# Patient Record
Sex: Female | Born: 1970 | Race: White | Hispanic: No | Marital: Married | Smoking: Never smoker
Health system: Southern US, Community
[De-identification: ages and names within clinical notes are randomized; demographics above are authoritative.]

## PROBLEM LIST (undated history)

## (undated) DIAGNOSIS — Z87442 Personal history of urinary calculi: Secondary | ICD-10-CM

## (undated) DIAGNOSIS — R112 Nausea with vomiting, unspecified: Secondary | ICD-10-CM

## (undated) DIAGNOSIS — Z973 Presence of spectacles and contact lenses: Secondary | ICD-10-CM

## (undated) DIAGNOSIS — Z8489 Family history of other specified conditions: Secondary | ICD-10-CM

## (undated) DIAGNOSIS — I341 Nonrheumatic mitral (valve) prolapse: Secondary | ICD-10-CM

## (undated) DIAGNOSIS — J45909 Unspecified asthma, uncomplicated: Secondary | ICD-10-CM

## (undated) DIAGNOSIS — I10 Essential (primary) hypertension: Secondary | ICD-10-CM

## (undated) DIAGNOSIS — Z9889 Other specified postprocedural states: Secondary | ICD-10-CM

## (undated) HISTORY — PX: ORIF WRIST FRACTURE: SHX2133

## (undated) HISTORY — PX: ABDOMINAL HYSTERECTOMY: SHX81

## (undated) HISTORY — PX: TONSILLECTOMY AND ADENOIDECTOMY: SUR1326

## (undated) HISTORY — PX: TUBAL LIGATION: SHX77

## (undated) HISTORY — PX: KNEE ARTHROSCOPY: SUR90

## (undated) HISTORY — PX: OOPHORECTOMY: SHX86

## (undated) HISTORY — PX: NASAL SINUS SURGERY: SHX719

---

## 1999-10-01 DIAGNOSIS — I639 Cerebral infarction, unspecified: Secondary | ICD-10-CM

## 1999-10-01 HISTORY — DX: Cerebral infarction, unspecified: I63.9

## 2004-04-10 ENCOUNTER — Other Ambulatory Visit: Payer: Self-pay

## 2005-01-21 ENCOUNTER — Ambulatory Visit: Payer: Self-pay | Admitting: Family Medicine

## 2007-03-11 ENCOUNTER — Ambulatory Visit: Payer: Self-pay | Admitting: Unknown Physician Specialty

## 2007-03-12 ENCOUNTER — Inpatient Hospital Stay: Payer: Self-pay | Admitting: Unknown Physician Specialty

## 2007-04-06 ENCOUNTER — Encounter: Payer: Self-pay | Admitting: Maternal & Fetal Medicine

## 2009-01-26 ENCOUNTER — Ambulatory Visit: Payer: Self-pay | Admitting: Family Medicine

## 2010-10-12 ENCOUNTER — Encounter: Payer: Self-pay | Admitting: Obstetrics and Gynecology

## 2010-10-31 ENCOUNTER — Encounter: Payer: Self-pay | Admitting: Obstetrics and Gynecology

## 2010-11-29 ENCOUNTER — Encounter: Payer: Self-pay | Admitting: Obstetrics and Gynecology

## 2011-01-29 ENCOUNTER — Ambulatory Visit: Payer: Self-pay | Admitting: Family Medicine

## 2011-05-12 ENCOUNTER — Emergency Department: Payer: Self-pay | Admitting: Internal Medicine

## 2012-06-05 LAB — CBC AND DIFFERENTIAL
HEMATOCRIT: 40 % (ref 36–46)
HEMOGLOBIN: 14.2 g/dL (ref 12.0–16.0)
Neutrophils Absolute: 3 /uL
Platelets: 240 10*3/uL (ref 150–399)
WBC: 5.5 10*3/mL

## 2012-06-05 LAB — BASIC METABOLIC PANEL
BUN: 18 mg/dL (ref 4–21)
CREATININE: 1 mg/dL (ref 0.5–1.1)
GLUCOSE: 78 mg/dL
POTASSIUM: 4.3 mmol/L (ref 3.4–5.3)
SODIUM: 139 mmol/L (ref 137–147)

## 2012-06-05 LAB — HEPATIC FUNCTION PANEL
ALK PHOS: 49 U/L (ref 25–125)
ALT: 11 U/L (ref 7–35)
AST: 15 U/L (ref 13–35)
BILIRUBIN, TOTAL: 0.6 mg/dL

## 2012-06-05 LAB — LIPID PANEL
Cholesterol: 157 mg/dL (ref 0–200)
HDL: 75 mg/dL — AB (ref 35–70)
LDL Cholesterol: 70 mg/dL
LDl/HDL Ratio: 0.9
Triglycerides: 61 mg/dL (ref 40–160)

## 2012-06-05 LAB — TSH: TSH: 2.16 u[IU]/mL (ref 0.41–5.90)

## 2015-04-14 LAB — VITAMIN D 25 HYDROXY (VIT D DEFICIENCY, FRACTURES): VIT D 25 HYDROXY: 36.6

## 2015-04-14 LAB — BASIC METABOLIC PANEL
BUN: 21 mg/dL (ref 4–21)
CREATININE: 1 mg/dL (ref 0.5–1.1)
Glucose: 76 mg/dL
Potassium: 4 mmol/L (ref 3.4–5.3)
Sodium: 141 mmol/L (ref 137–147)

## 2015-04-14 LAB — CBC AND DIFFERENTIAL
HEMATOCRIT: 39 % (ref 36–46)
Hemoglobin: 13.9 g/dL (ref 12.0–16.0)
Neutrophils Absolute: 3 /uL
PLATELETS: 259 10*3/uL (ref 150–399)
WBC: 5.5 10*3/mL

## 2015-04-14 LAB — HEPATIC FUNCTION PANEL
ALK PHOS: 57 U/L (ref 25–125)
ALT: 15 U/L (ref 7–35)
AST: 24 U/L (ref 13–35)
Bilirubin, Total: 0.6 mg/dL

## 2015-04-14 LAB — LIPID PANEL
CHOLESTEROL: 180 mg/dL (ref 0–200)
HDL: 87 mg/dL — AB (ref 35–70)
LDL CALC: 79 mg/dL
Triglycerides: 69 mg/dL (ref 40–160)

## 2015-04-14 LAB — TSH: TSH: 2.71 u[IU]/mL (ref 0.41–5.90)

## 2015-09-19 ENCOUNTER — Encounter: Payer: Self-pay | Admitting: Family Medicine

## 2015-12-16 ENCOUNTER — Other Ambulatory Visit: Payer: Self-pay | Admitting: Family Medicine

## 2015-12-16 DIAGNOSIS — I10 Essential (primary) hypertension: Secondary | ICD-10-CM | POA: Insufficient documentation

## 2015-12-16 DIAGNOSIS — N200 Calculus of kidney: Secondary | ICD-10-CM | POA: Insufficient documentation

## 2015-12-16 NOTE — Telephone Encounter (Signed)
Last OV 10/2014 

## 2016-02-28 ENCOUNTER — Other Ambulatory Visit: Payer: Self-pay | Admitting: Family Medicine

## 2016-02-28 DIAGNOSIS — I10 Essential (primary) hypertension: Secondary | ICD-10-CM

## 2016-03-08 ENCOUNTER — Other Ambulatory Visit: Payer: Self-pay

## 2016-03-08 DIAGNOSIS — I10 Essential (primary) hypertension: Secondary | ICD-10-CM

## 2016-03-08 MED ORDER — METOPROLOL SUCCINATE ER 25 MG PO TB24: 12.5000 mg | ORAL_TABLET | Freq: Every day | ORAL | Status: DC

## 2016-03-23 ENCOUNTER — Other Ambulatory Visit: Payer: Self-pay | Admitting: Family Medicine

## 2016-03-23 DIAGNOSIS — I10 Essential (primary) hypertension: Secondary | ICD-10-CM

## 2016-03-28 DIAGNOSIS — J45909 Unspecified asthma, uncomplicated: Secondary | ICD-10-CM | POA: Insufficient documentation

## 2016-03-28 DIAGNOSIS — J309 Allergic rhinitis, unspecified: Secondary | ICD-10-CM | POA: Insufficient documentation

## 2016-03-28 DIAGNOSIS — Z8673 Personal history of transient ischemic attack (TIA), and cerebral infarction without residual deficits: Secondary | ICD-10-CM | POA: Insufficient documentation

## 2016-03-28 DIAGNOSIS — I4891 Unspecified atrial fibrillation: Secondary | ICD-10-CM | POA: Insufficient documentation

## 2016-03-28 DIAGNOSIS — I341 Nonrheumatic mitral (valve) prolapse: Secondary | ICD-10-CM | POA: Insufficient documentation

## 2016-03-28 DIAGNOSIS — I4892 Unspecified atrial flutter: Secondary | ICD-10-CM

## 2016-03-28 DIAGNOSIS — IMO0002 Reserved for concepts with insufficient information to code with codable children: Secondary | ICD-10-CM | POA: Insufficient documentation

## 2016-03-28 DIAGNOSIS — N83209 Unspecified ovarian cyst, unspecified side: Secondary | ICD-10-CM | POA: Insufficient documentation

## 2016-04-01 ENCOUNTER — Ambulatory Visit (INDEPENDENT_AMBULATORY_CARE_PROVIDER_SITE_OTHER): Payer: BC Managed Care – PPO | Admitting: Family Medicine

## 2016-04-01 ENCOUNTER — Encounter: Payer: Self-pay | Admitting: Family Medicine

## 2016-04-01 VITALS — BP 120/84 | HR 60 | Temp 97.6°F | Resp 16 | Wt 137.0 lb

## 2016-04-01 DIAGNOSIS — J302 Other seasonal allergic rhinitis: Secondary | ICD-10-CM

## 2016-04-01 DIAGNOSIS — Z09 Encounter for follow-up examination after completed treatment for conditions other than malignant neoplasm: Secondary | ICD-10-CM

## 2016-04-01 DIAGNOSIS — M545 Low back pain: Secondary | ICD-10-CM | POA: Diagnosis not present

## 2016-04-01 DIAGNOSIS — I10 Essential (primary) hypertension: Secondary | ICD-10-CM

## 2016-04-01 MED ORDER — METOPROLOL SUCCINATE ER 25 MG PO TB24: 12.5000 mg | ORAL_TABLET | Freq: Every day | ORAL | Status: DC

## 2016-04-01 MED ORDER — CYCLOBENZAPRINE HCL 5 MG PO TABS: 5.0000 mg | ORAL_TABLET | Freq: Every day | ORAL | Status: DC

## 2016-04-01 MED ORDER — MOMETASONE FUROATE 50 MCG/ACT NA SUSP: 2.0000 | Freq: Every day | NASAL | Status: AC

## 2016-04-01 NOTE — Progress Notes (Signed)
Lindsey Coleman  MRN: [id number] DOB: Feb 21, 1971  Subjective:  HPI   The patient is a 45 year old female that is a former patient of Dr. Sharyon Medicus.  She presents today for follow up on her hypertension.  She is in need of refill on her Metoprolol.  She does not check her blood pressure regularly.  She does check it when she is feeling bad.   The last time she checked it she states it was about 1 month ago and she had a normal reading. 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 Patient Active Problem List   Diagnosis Date Noted  . Allergic rhinitis 03/28/2016  . Asthma, exogenous 03/28/2016  . 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 Cyst of ovary 03/28/2016  . Flutter-fibrillation 03/28/2016  . Calculus of kidney 12/16/2015  . BP (high blood pressure) 12/16/2015    No past medical history on file.  Social History   Social History  . Marital Status: Married    Spouse Name: N/A  . Number of Children: N/A  . Years of Education: N/A   Occupational History  . Not on file.   Social History Main Topics  . Smoking status: Never Smoker   . Smokeless tobacco: Not on file  . Alcohol Use: 0.0 oz/week    0 Standard drinks or equivalent per week     Comment: 1 per week  . Drug Use: No  . Sexual Activity: Not on file   Other Topics Concern  . Not on file   Social History Narrative    Outpatient Prescriptions Prior to Visit  Medication Sig Dispense Refill  . aspirin 81 MG tablet Take by mouth.    . budesonide-formoterol (SYMBICORT) 160-4.5 MCG/ACT inhaler Inhale into the lungs.    . Cetirizine HCl (ZYRTEC ALLERGY) 10 MG CAPS Take by mouth.    . Cranberry 500 MG CAPS Take by mouth.    . cyclobenzaprine (FLEXERIL) 5 MG tablet Take by mouth.    . levalbuterol (XOPENEX HFA) 45 MCG/ACT inhaler Inhale into the lungs.    .  metoprolol succinate (TOPROL-XL) 25 MG 24 hr tablet Take 0.5 tablets (12.5 mg total) by mouth daily. Pt needs to schedule an office visit before anymore refills. 7 tablet 0  . mometasone (NASONEX) 50 MCG/ACT nasal spray Place into the nose.    . montelukast (SINGULAIR) 10 MG tablet Take by mouth.     No facility-administered medications prior to visit.    Allergies  Allergen Reactions  . Codeine Hives and Nausea And Vomiting    Other reaction(s): Headache, Hives  . Hydrocodone-Acetaminophen     Other reaction(s): Headache, Hives  . Meperidine Hives and Nausea And Vomiting    Other reaction(s): Headache, Hives  . Morphine Hives and Nausea And Vomiting    Other reaction(s): Headache, Hives  . Sulfa Antibiotics Itching and Rash    Review of Systems  Constitutional: Negative for fever and malaise/fatigue.  HENT: Positive for congestion (clear head congestion) and ear pain (clicking in left ear, possibly an echo, fullness). Negative for ear discharge, hearing loss, nosebleeds, sore throat and tinnitus.   Eyes: Negative for blurred vision, double vision, photophobia, pain, discharge and redness.  Respiratory: Positive for cough (allergy related). Negative for shortness of breath and wheezing.   Cardiovascular: Negative for chest pain, palpitations, orthopnea, claudication, leg swelling  and PND.  Gastrointestinal: Negative.   Neurological: Positive for headaches (allergy related). Negative for dizziness (allergy related) and weakness.  Endo/Heme/Allergies: Negative.   Psychiatric/Behavioral: Negative.    Objective:  BP 120/84 mmHg  Pulse 60  Temp(Src) 97.6 F (36.4 C) (Oral)  Resp 16  Wt 137 lb (62.143 kg)  Physical Exam  Constitutional: She is oriented to person, place, and time and well-developed, well-nourished, and in no distress.  HENT:  Head: Normocephalic and atraumatic.  Right Ear: External ear normal.  Left Ear: External ear normal.  Nose: Nose normal.  Eyes:  Conjunctivae are normal.  Neck: Neck supple.  Cardiovascular: Normal rate, regular rhythm and normal heart sounds.   Pulmonary/Chest: Effort normal and breath sounds normal.  Abdominal: Soft.  Neurological: She is alert and oriented to person, place, and time.  Skin: Skin is warm and dry.  Psychiatric: Mood, memory, affect and judgment normal.    Assessment and Plan :  No diagnosis found. Hypertension Controlled Allergic rhinitis with asthma History of TIA in 2001 History of anxiety attacks Possible history of atrial fib although very unclear If patient his has any cardiac symptoms she will come back in. May need for cardiology evaluation.  [name] MD [facility] 04/01/2016 9:57 AM

## 2016-11-27 ENCOUNTER — Ambulatory Visit: Payer: BC Managed Care – PPO | Admitting: Family Medicine

## 2016-12-16 ENCOUNTER — Ambulatory Visit (INDEPENDENT_AMBULATORY_CARE_PROVIDER_SITE_OTHER): Payer: BC Managed Care – PPO | Admitting: Family Medicine

## 2016-12-16 ENCOUNTER — Encounter: Payer: Self-pay | Admitting: Family Medicine

## 2016-12-16 VITALS — BP 122/78 | HR 74 | Resp 12 | Wt 139.0 lb

## 2016-12-16 DIAGNOSIS — M545 Low back pain, unspecified: Secondary | ICD-10-CM

## 2016-12-16 DIAGNOSIS — R5383 Other fatigue: Secondary | ICD-10-CM

## 2016-12-16 DIAGNOSIS — J302 Other seasonal allergic rhinitis: Secondary | ICD-10-CM | POA: Diagnosis not present

## 2016-12-16 DIAGNOSIS — I10 Essential (primary) hypertension: Secondary | ICD-10-CM

## 2016-12-16 DIAGNOSIS — R140 Abdominal distension (gaseous): Secondary | ICD-10-CM | POA: Diagnosis not present

## 2016-12-16 MED ORDER — PROBIOTIC 250 MG PO CAPS: 1.0000 | ORAL_CAPSULE | Freq: Every day | ORAL | 0 refills | Status: AC
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
## 2016-12-16 NOTE — Patient Instructions (Addendum)
Try Probiotic over the counter 1 tablet or capsule daily. Also try Metamucil daily. Try to do gluten free diet and follow up on symptoms. Try stretching/yoga to see if back pains gets better.

## 2016-12-19 LAB — COMPREHENSIVE METABOLIC PANEL
ALT: 18 IU/L (ref 0–32)
AST: 20 IU/L (ref 0–40)
Albumin/Globulin Ratio: 2 (ref 1.2–2.2)
Albumin: 4.7 g/dL (ref 3.5–5.5)
Alkaline Phosphatase: 54 IU/L (ref 39–117)
BILIRUBIN TOTAL: 0.6 mg/dL (ref 0.0–1.2)
BUN / CREAT RATIO: 25 — AB (ref 9–23)
BUN: 23 mg/dL (ref 6–24)
CHLORIDE: 103 mmol/L (ref 96–106)
CO2: 25 mmol/L (ref 18–29)
CREATININE: 0.92 mg/dL (ref 0.57–1.00)
Calcium: 9.7 mg/dL (ref 8.7–10.2)
GFR calc Af Amer: 86 mL/min/{1.73_m2} (ref >59)
GFR calc non Af Amer: 74 mL/min/{1.73_m2} (ref >59)
Globulin, Total: 2.3 g/dL (ref 1.5–4.5)
Glucose: 79 mg/dL (ref 65–99)
Potassium: 4.3 mmol/L (ref 3.5–5.2)
Sodium: 144 mmol/L (ref 134–144)
TOTAL PROTEIN: 7 g/dL (ref 6.0–8.5)

## 2016-12-19 LAB — CBC WITH DIFFERENTIAL/PLATELET
BASOS ABS: 0 10*3/uL (ref 0.0–0.2)
Basos: 1 %
EOS (ABSOLUTE): 0.6 10*3/uL — AB (ref 0.0–0.4)
Eos: 9 %
Hematocrit: 38 % (ref 34.0–46.6)
Hemoglobin: 13.4 g/dL (ref 11.1–15.9)
IMMATURE GRANS (ABS): 0 10*3/uL (ref 0.0–0.1)
IMMATURE GRANULOCYTES: 0 %
LYMPHS ABS: 2.1 10*3/uL (ref 0.7–3.1)
Lymphs: 34 %
MCH: 30.7 pg (ref 26.6–33.0)
MCHC: 35.3 g/dL (ref 31.5–35.7)
MCV: 87 fL (ref 79–97)
Monocytes Absolute: 0.5 10*3/uL (ref 0.1–0.9)
Monocytes: 8 %
Neutrophils Absolute: 3.1 10*3/uL (ref 1.4–7.0)
Neutrophils: 48 %
PLATELETS: 236 10*3/uL (ref 150–379)
RBC: 4.36 x10E6/uL (ref 3.77–5.28)
RDW: 12.6 % (ref 12.3–15.4)
WBC: 6.3 10*3/uL (ref 3.4–10.8)

## 2016-12-19 LAB — LIPID PANEL WITH LDL/HDL RATIO
CHOLESTEROL TOTAL: 154 mg/dL (ref 100–199)
HDL: 66 mg/dL (ref >39)
LDL Calculated: 75 mg/dL (ref 0–99)
LDl/HDL Ratio: 1.1 ratio units (ref 0.0–3.2)
TRIGLYCERIDES: 63 mg/dL (ref 0–149)
VLDL CHOLESTEROL CAL: 13 mg/dL (ref 5–40)

## 2016-12-19 LAB — TSH: TSH: 2.49 u[IU]/mL (ref 0.450–4.500)

## 2016-12-25 ENCOUNTER — Telehealth: Payer: Self-pay | Admitting: Family Medicine

## 2016-12-25 NOTE — Progress Notes (Signed)
Advised  ED 

## 2016-12-25 NOTE — Telephone Encounter (Signed)
Pt returned call ° °Thanks, teri °

## 2017-01-27 ENCOUNTER — Ambulatory Visit: Payer: BC Managed Care – PPO | Admitting: Family Medicine

## 2017-01-29 ENCOUNTER — Ambulatory Visit (INDEPENDENT_AMBULATORY_CARE_PROVIDER_SITE_OTHER): Payer: BC Managed Care – PPO | Admitting: Family Medicine

## 2017-01-29 VITALS — BP 118/78 | HR 62 | Resp 16 | Wt 140.0 lb

## 2017-01-29 DIAGNOSIS — R140 Abdominal distension (gaseous): Secondary | ICD-10-CM | POA: Diagnosis not present

## 2017-01-29 DIAGNOSIS — M545 Low back pain, unspecified: Secondary | ICD-10-CM

## 2017-01-29 MED ORDER — LINACLOTIDE 145 MCG PO CAPS: 145.0000 ug | ORAL_CAPSULE | Freq: Every day | ORAL | 0 refills | Status: DC

## 2017-01-29 MED ORDER — HYOSCYAMINE SULFATE 0.125 MG PO TABS: 0.1250 mg | ORAL_TABLET | ORAL | 5 refills | Status: DC | PRN

## 2017-01-29 MED ORDER — LINACLOTIDE 290 MCG PO CAPS: 290.0000 ug | ORAL_CAPSULE | Freq: Every day | ORAL | 0 refills | Status: DC

## 2017-01-29 NOTE — Patient Instructions (Signed)
Will go ahead and put referral to GI doctor so we can have this set up for you just in case because this usually takes a few weeks to get in and can take even 2 months before you can be seen.

## 2017-01-29 NOTE — Progress Notes (Signed)
Lindsey Coleman  MRN: [id number] DOB: [birth year]  Subjective:  HPI  Patient is here for 1 month follow up. On her last visit bloating and bowel movements were addressed. Patient was advised to try Probiotic daily and she is doing that, this has not helped with bloating sensation that she gets but has helped with her bowels. She is doing gluten free diet to the extend they have been before,she is trying to watch things like junk food and sodas intake.  Her back pain is better, she is doing stretching. She does mention that she had right foot pain at the bottom of her last 3 toes that woke her up at night and lasted for 2 days after that. It hurt with walking and bending her foot. Pain has resolved at this time. No trauma to the foot that patient recalls.  Patient Active Problem List   Diagnosis Date Noted  . Allergic rhinitis 03/28/2016  . Asthma, exogenous 03/28/2016  . 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 Cyst of ovary 03/28/2016  . Flutter-fibrillation 03/28/2016  . Calculus of kidney 12/16/2015  . BP (high blood pressure) 12/16/2015    No past medical history on file.  Social History   Social History  . Marital status: Married    Spouse name: N/A  . Number of children: N/A  . Years of education: N/A   Occupational History  . Not on file.   Social History Main Topics  . Smoking status: Never Smoker  . Smokeless tobacco: Never Used  . Alcohol use 0.0 oz/week     Comment: 1 per week  . Drug use: No  . Sexual activity: Not on file   Other Topics Concern  . Not on file   Social History Narrative  . No narrative on file    Outpatient Encounter Prescriptions as of 01/29/2017  Medication Sig Note  . aspirin 81 MG tablet Take by mouth. 03/28/2016: Received from: [facility]  . Cetirizine HCl (ZYRTEC ALLERGY) 10 MG CAPS Take by mouth.  03/28/2016: Received from: [facility]  . Cranberry 500 MG CAPS Take by mouth. 03/28/2016: Received from: [facility]  . cyclobenzaprine (FLEXERIL) 5 MG tablet Take 1 tablet (5 mg total) by mouth at bedtime.   . levalbuterol (XOPENEX HFA) 45 MCG/ACT inhaler Inhale into the lungs. 03/28/2016: Medication taken as needed.  Received from: [facility]  . metoprolol succinate (TOPROL-XL) 25 MG 24 hr tablet Take 0.5 tablets (12.5 mg total) by mouth daily. Pt needs to schedule an office visit before anymore refills.   . mometasone (NASONEX) 50 MCG/ACT nasal spray Place 2 sprays into the nose daily.   . montelukast (SINGULAIR) 10 MG tablet Take by mouth. 03/28/2016: Received from: [facility]  . Saccharomyces boulardii (PROBIOTIC) 250 MG CAPS Take 1 capsule by mouth daily.    No facility-administered encounter medications on file as of 01/29/2017.     Allergies  Allergen Reactions  . Codeine Hives and Nausea And Vomiting    Other reaction(s): Headache, Hives  . Hydrocodone-Acetaminophen     Other reaction(s): Headache, Hives  . Meperidine Hives and Nausea And Vomiting    Other reaction(s): Headache, Hives  . Morphine Hives and Nausea And Vomiting    Other reaction(s): Headache, Hives  . Sulfa Antibiotics Itching and Rash    Review of Systems  Constitutional: Negative.   Respiratory: Negative.  Cardiovascular: Negative.   Gastrointestinal:       Bloating, bowel irregular better  Musculoskeletal: Positive for back pain (better).    Objective:  BP 118/78   Pulse 62   Resp 16   Wt 140 lb (63.5 kg)   Physical Exam  Constitutional: She is oriented to person, place, and time and well-developed, well-nourished, and in no distress.  HENT:  Head: Normocephalic and atraumatic.  Eyes: Conjunctivae are normal. Pupils are equal, round, and reactive to light.  Cardiovascular: Normal rate, regular rhythm, normal heart sounds and  intact distal pulses.   No murmur heard. Pulmonary/Chest: Effort normal and breath sounds normal. No respiratory distress. She has no wheezes.  Abdominal: Soft. Bowel sounds are normal. She exhibits no distension. There is no tenderness.  Neurological: She is alert and oriented to person, place, and time.   Assessment and Plan :  1. Abdominal bloating The same. Try Linzess 145 mg and then take 290 mg. Also try Hyoscyamine as needed. Referral to GI placed and patient advised if symptoms improve with these medications patient can cancel that appointment. - Ambulatory referral to Gastroenterology  2. Acute bilateral low back pain without sciatica Better.Try stretching.  HPI, Exam and A&P transcribed by [name], RMA under direction and in the presence of [name], MD. I have done the exam and reviewed the chart and it is accurate to the best of my knowledge. Development worker, community has been used and  any errors in dictation or transcription are unintentional. [name] M.D. [facility]

## 2017-02-24 ENCOUNTER — Other Ambulatory Visit: Payer: Self-pay | Admitting: Family Medicine

## 2017-03-11 ENCOUNTER — Ambulatory Visit: Payer: Self-pay | Admitting: Gastroenterology

## 2017-03-26 ENCOUNTER — Telehealth: Payer: Self-pay | Admitting: *Deleted

## 2017-03-26 NOTE — Telephone Encounter (Signed)
Diarrhea yes--nosebleeds no.Try every other day until follow up visit.

## 2017-03-26 NOTE — Telephone Encounter (Signed)
LMTCB ED 

## 2017-03-26 NOTE — Telephone Encounter (Signed)
Please review. Thanks!  

## 2017-03-26 NOTE — Telephone Encounter (Signed)
Patient called office concerning medication Linzess. Patient stated that she was prescribed Linzess a few weeks and has been having diarrhea and nose bleeds since right after starting the medication. Patient wants to know if this is normal? Please advise?

## 2017-03-27 NOTE — Telephone Encounter (Signed)
Advised  ED 

## 2017-04-05 ENCOUNTER — Other Ambulatory Visit: Payer: Self-pay | Admitting: Family Medicine

## 2017-04-05 DIAGNOSIS — I10 Essential (primary) hypertension: Secondary | ICD-10-CM

## 2017-05-01 ENCOUNTER — Telehealth: Payer: Self-pay | Admitting: Family Medicine

## 2017-05-01 MED ORDER — LINACLOTIDE 145 MCG PO CAPS: 145.0000 ug | ORAL_CAPSULE | Freq: Every day | ORAL | 12 refills | Status: DC

## 2017-05-01 NOTE — Telephone Encounter (Signed)
Pt called saying she is having diarrhea with the linzess 290 mcg. Once a day  She wants to know if she should adjust the dosage  She uses [facility]  Pt's call back  [phone]  Thanks, Con Memos

## 2017-05-01 NOTE — Telephone Encounter (Signed)
Cut dose in half. thx

## 2017-05-01 NOTE — Telephone Encounter (Signed)
Pt advised and RX sent in for Linzess 145 mg-aa

## 2017-05-01 NOTE — Telephone Encounter (Signed)
Please review-aa 

## 2017-06-06 ENCOUNTER — Encounter: Payer: Self-pay | Admitting: Physician Assistant

## 2017-06-06 ENCOUNTER — Ambulatory Visit (INDEPENDENT_AMBULATORY_CARE_PROVIDER_SITE_OTHER): Payer: BC Managed Care – PPO | Admitting: Physician Assistant

## 2017-06-06 VITALS — BP 118/80 | HR 72 | Temp 97.2°F | Resp 16 | Wt 142.0 lb

## 2017-06-06 DIAGNOSIS — K581 Irritable bowel syndrome with constipation: Secondary | ICD-10-CM | POA: Diagnosis not present

## 2017-06-06 DIAGNOSIS — B359 Dermatophytosis, unspecified: Secondary | ICD-10-CM | POA: Diagnosis not present

## 2017-06-06 MED ORDER — CLOTRIMAZOLE 1 % EX CREA: 1.0000 "application " | TOPICAL_CREAM | Freq: Two times a day (BID) | CUTANEOUS | 0 refills | Status: DC

## 2017-06-06 NOTE — Progress Notes (Signed)
Patient: Lindsey Coleman Female    DOB: Aug 11, 1971   46 y.o.   MRN: [id number] Visit Date: 06/06/2017  Today's Provider: Mar Daring, PA-C   Chief Complaint  Patient presents with  . Rash   Subjective:    Rash  This is a new problem. The current episode started 1 to 4 weeks ago. Location: Left knee. The rash is characterized by redness and itchiness. It is unknown if there was an exposure to a precipitant. Associated symptoms include diarrhea. Treatments tried: some "old" cream. The treatment provided no relief.      Allergies  Allergen Reactions  . Codeine Hives and Nausea And Vomiting    Other reaction(s): Headache, Hives  . Hydrocodone-Acetaminophen     Other reaction(s): Headache, Hives  . Meperidine Hives and Nausea And Vomiting    Other reaction(s): Headache, Hives  . Morphine Hives and Nausea And Vomiting    Other reaction(s): Headache, Hives  . Sulfa Antibiotics Itching and Rash     Current Outpatient Prescriptions:  .  aspirin 81 MG tablet, Take by mouth., Disp: , Rfl:  .  Cetirizine HCl (ZYRTEC ALLERGY) 10 MG CAPS, Take by mouth., Disp: , Rfl:  .  Cranberry 500 MG CAPS, Take by mouth., Disp: , Rfl:  .  cyclobenzaprine (FLEXERIL) 5 MG tablet, Take 1 tablet (5 mg total) by mouth at bedtime., Disp: 30 tablet, Rfl: 3 .  levalbuterol (XOPENEX HFA) 45 MCG/ACT inhaler, Inhale into the lungs., Disp: , Rfl:  .  linaclotide (LINZESS) 145 MCG CAPS capsule, Take 1 capsule (145 mcg total) by mouth daily before breakfast., Disp: 30 capsule, Rfl: 12 .  metoprolol succinate (TOPROL-XL) 25 MG 24 hr tablet, TAKE 0.5 TABLETS (12.5 MG TOTAL) BY MOUTH DAILY. PT NEEDS TO SCHEDULE AN OFFICE VISIT BEFORE ANYMORE REFILLS., Disp: 15 tablet, Rfl: 11 .  mometasone (NASONEX) 50 MCG/ACT nasal spray, Place 2 sprays into the nose daily., Disp: 17 g, Rfl: 12 .  montelukast (SINGULAIR) 10 MG tablet, Take by mouth., Disp: , Rfl:  .  Saccharomyces boulardii (PROBIOTIC) 250 MG CAPS, Take  1 capsule by mouth daily., Disp: 30 capsule, Rfl: 0 .  hyoscyamine (LEVSIN, ANASPAZ) 0.125 MG tablet, Take 1 tablet (0.125 mg total) by mouth every 4 (four) hours as needed., Disp: 60 tablet, Rfl: 5  Review of Systems  Constitutional: Negative.   Respiratory: Negative.   Cardiovascular: Negative.   Gastrointestinal: Positive for constipation and diarrhea.  Genitourinary: Negative.   Musculoskeletal: Negative.   Skin: Positive for rash.  Neurological: Negative.     Social History  Substance Use Topics  . Smoking status: Never Smoker  . Smokeless tobacco: Never Used  . Alcohol use 0.0 oz/week     Comment: 1 per week   Objective:   BP 118/80 (BP Location: Right Arm, Patient Position: Sitting, Cuff Size: Normal)   Pulse 72   Temp (!) 97.2 F (36.2 C) (Oral)   Resp 16   Wt 142 lb (64.4 kg)    Physical Exam  Constitutional: She appears well-developed and well-nourished. No distress.  Neck: Normal range of motion. Neck supple.  Cardiovascular: Normal rate, regular rhythm and normal heart sounds.  Exam reveals no gallop and no friction rub.   No murmur heard. Pulmonary/Chest: Effort normal and breath sounds normal. No respiratory distress. She has no wheezes. She has no rales.  Skin: Rash noted. Rash is macular. She is not diaphoretic.     Vitals reviewed.  Assessment & Plan:     1. Ringworm Clotrimazole given as below for ringworm. She is to call if no improvement.  - clotrimazole (LOTRIMIN) 1 % cream; Apply 1 application topically 2 (two) times daily.  Dispense: 30 g; Refill: 0  2. Irritable bowel syndrome with constipation She is currently on Linzess 151mcg but she is reporting increased diarrhea and reports she sometimes won't take the medication due to this, which then leads to the constipation and bloating again. I have given her a few samples of Linzess 66mcg to see if this works better and lessens the diarrhea. She is to call if this works better for refills.          Mar Daring, PA-C  [facility]

## 2017-06-06 NOTE — Patient Instructions (Signed)

## 2017-06-07 DIAGNOSIS — K581 Irritable bowel syndrome with constipation: Secondary | ICD-10-CM | POA: Insufficient documentation

## 2017-07-02 ENCOUNTER — Telehealth: Payer: Self-pay

## 2017-07-02 DIAGNOSIS — K581 Irritable bowel syndrome with constipation: Secondary | ICD-10-CM

## 2017-07-02 MED ORDER — LINACLOTIDE 72 MCG PO CAPS: 72.0000 ug | ORAL_CAPSULE | Freq: Every day | ORAL | 11 refills | Status: DC

## 2017-07-02 NOTE — Telephone Encounter (Signed)
Pt wanted to call to inform you that the Linzess 72 mcg is improving her sx, and would like to start this dose rather than the 145 mcg.

## 2017-07-02 NOTE — Telephone Encounter (Signed)
Linzess 30mcg sent in

## 2017-10-29 ENCOUNTER — Ambulatory Visit (INDEPENDENT_AMBULATORY_CARE_PROVIDER_SITE_OTHER): Payer: No Typology Code available for payment source | Admitting: Family Medicine

## 2017-10-29 VITALS — BP 122/88 | HR 82 | Temp 97.6°F | Resp 16 | Ht 67.0 in | Wt 141.0 lb

## 2017-10-29 DIAGNOSIS — I10 Essential (primary) hypertension: Secondary | ICD-10-CM | POA: Diagnosis not present

## 2017-10-29 DIAGNOSIS — Z01818 Encounter for other preprocedural examination: Secondary | ICD-10-CM

## 2017-10-29 NOTE — Progress Notes (Signed)
Patient: Lindsey Coleman Female    DOB: 1971/03/07   47 y.o.   MRN: [id number] Visit Date: 10/29/2017  Today's Provider: Wilhemena Durie, MD   Chief Complaint  Patient presents with  . Medical Clearance   Subjective:    HPI Pt is here today for surgical clearance. She is having a closed fracture distal radius repair by Dr. Peggye Ley at [facility]. Pt reports that she fell in the staff parking lot on 10/13/17. Othro reports that the bones have moved. She wants to be sure that the surgeon knows which she has told them that she gets sick with any anaesthesia. She remembers something about putting a patch on the back of her ear to help with the nausea the night before the surgery so that the nausea vomiting is not as severe. She does not have any other issues with anaesthesia that she knows of. She will need labs per their clearance.       Allergies  Allergen Reactions  . Codeine Hives and Nausea And Vomiting    Other reaction(s): Headache, Hives  . Hydrocodone-Acetaminophen     Other reaction(s): Headache, Hives  . Meperidine Hives and Nausea And Vomiting    Other reaction(s): Headache, Hives  . Morphine Hives and Nausea And Vomiting    Other reaction(s): Headache, Hives  . Sulfa Antibiotics Itching and Rash     Current Outpatient Medications:  .  aspirin 81 MG tablet, Take by mouth., Disp: , Rfl:  .  Cetirizine HCl (ZYRTEC ALLERGY) 10 MG CAPS, Take by mouth., Disp: , Rfl:  .  levalbuterol (XOPENEX HFA) 45 MCG/ACT inhaler, Inhale into the lungs., Disp: , Rfl:  .  linaclotide (LINZESS) 72 MCG capsule, Take 1 capsule (72 mcg total) by mouth daily before breakfast., Disp: 30 capsule, Rfl: 11 .  metoprolol succinate (TOPROL-XL) 25 MG 24 hr tablet, TAKE 0.5 TABLETS (12.5 MG TOTAL) BY MOUTH DAILY. PT NEEDS TO SCHEDULE AN OFFICE VISIT BEFORE ANYMORE REFILLS., Disp: 15 tablet, Rfl: 11 .  mometasone (NASONEX) 50 MCG/ACT nasal spray, Place 2 sprays into the nose daily., Disp: 17 g,  Rfl: 12 .  montelukast (SINGULAIR) 10 MG tablet, Take by mouth., Disp: , Rfl:  .  Saccharomyces boulardii (PROBIOTIC) 250 MG CAPS, Take 1 capsule by mouth daily., Disp: 30 capsule, Rfl: 0 .  clotrimazole (LOTRIMIN) 1 % cream, Apply 1 application topically 2 (two) times daily., Disp: 30 g, Rfl: 0 .  Cranberry 500 MG CAPS, Take by mouth., Disp: , Rfl:  .  cyclobenzaprine (FLEXERIL) 5 MG tablet, Take 1 tablet (5 mg total) by mouth at bedtime. (Patient not taking: Reported on 10/29/2017), Disp: 30 tablet, Rfl: 3 .  hyoscyamine (LEVSIN, ANASPAZ) 0.125 MG tablet, Take 1 tablet (0.125 mg total) by mouth every 4 (four) hours as needed., Disp: 60 tablet, Rfl: 5  Review of Systems  Constitutional: Negative.   HENT: Negative.   Eyes: Negative.   Respiratory: Negative.   Cardiovascular: Negative.   Gastrointestinal: Negative.   Endocrine: Negative.   Genitourinary: Negative.   Musculoskeletal: Positive for arthralgias.  Skin: Negative.   Allergic/Immunologic: Negative.   Neurological: Negative.   Hematological: Negative.   Psychiatric/Behavioral: Negative.     Social History   Tobacco Use  . Smoking status: Never Smoker  . Smokeless tobacco: Never Used  Substance Use Topics  . Alcohol use: Yes    Alcohol/week: 0.0 oz    Comment: 1 per week   Objective:  BP 122/88 (BP Location: Left Arm, Patient Position: Sitting, Cuff Size: Normal)   Pulse 82   Temp 97.6 F (36.4 C) (Oral)   Resp 16   Ht 5\' 7"  (1.702 m)   Wt 141 lb (64 kg)   SpO2 98%   BMI 22.08 kg/m  Vitals:   10/29/17 1117  BP: 122/88  Pulse: 82  Resp: 16  Temp: 97.6 F (36.4 C)  TempSrc: Oral  SpO2: 98%  Weight: 141 lb (64 kg)  Height: 5\' 7"  (1.702 m)     Physical Exam  Constitutional: She is oriented to person, place, and time. She appears well-developed and well-nourished.  Eyes: Conjunctivae and EOM are normal. Pupils are equal, round, and reactive to light.  Neck: Normal range of motion. Neck supple.    Cardiovascular: Normal rate, regular rhythm, normal heart sounds and intact distal pulses.  Pulmonary/Chest: Effort normal and breath sounds normal.  Neurological: She is alert and oriented to person, place, and time. She has normal reflexes.  Skin: Skin is warm and dry.  Psychiatric: She has a normal mood and affect. Her behavior is normal. Judgment and thought content normal.        Assessment & Plan:     1. Pre-operative clearance Cleared for surgery. Anesthesia will need to address possible  issues and postoperative pain control may be difficult early on.  - EKG 12-Lead 2.Allergic Rhinitis 3.IBS 4.HTN    HPI, Exam, and A&P Transcribed under the direction and in the presence of Richard L. Cranford Mon, MD  Electronically Signed: Katina Dung, CMA  I have done the exam and reviewed the above chart and it is accurate to the best of my knowledge. Development worker, community has been used in this note in any air is in the dictation or transcription are unintentional.  Wilhemena Durie, MD  [place]

## 2017-10-30 ENCOUNTER — Telehealth: Payer: Self-pay | Admitting: Emergency Medicine

## 2017-10-30 ENCOUNTER — Ambulatory Visit: Payer: BC Managed Care – PPO | Admitting: Family Medicine

## 2017-10-30 ENCOUNTER — Telehealth: Payer: Self-pay | Admitting: Family Medicine

## 2017-10-30 LAB — CBC WITH DIFFERENTIAL/PLATELET
BASOS ABS: 0 10*3/uL (ref 0.0–0.2)
Basos: 0 %
EOS (ABSOLUTE): 0.4 10*3/uL (ref 0.0–0.4)
Eos: 7 %
Hematocrit: 38.2 % (ref 34.0–46.6)
Hemoglobin: 13.8 g/dL (ref 11.1–15.9)
IMMATURE GRANS (ABS): 0 10*3/uL (ref 0.0–0.1)
Immature Granulocytes: 0 %
LYMPHS: 31 %
Lymphocytes Absolute: 1.9 10*3/uL (ref 0.7–3.1)
MCH: 31 pg (ref 26.6–33.0)
MCHC: 36.1 g/dL — ABNORMAL HIGH (ref 31.5–35.7)
MCV: 86 fL (ref 79–97)
MONOS ABS: 0.3 10*3/uL (ref 0.1–0.9)
Monocytes: 5 %
NEUTROS ABS: 3.5 10*3/uL (ref 1.4–7.0)
NEUTROS PCT: 57 %
PLATELETS: 263 10*3/uL (ref 150–379)
RBC: 4.45 x10E6/uL (ref 3.77–5.28)
RDW: 12.6 % (ref 12.3–15.4)
WBC: 6.2 10*3/uL (ref 3.4–10.8)

## 2017-10-30 LAB — COMPREHENSIVE METABOLIC PANEL
A/G RATIO: 2.1 (ref 1.2–2.2)
ALK PHOS: 61 IU/L (ref 39–117)
ALT: 14 IU/L (ref 0–32)
AST: 18 IU/L (ref 0–40)
Albumin: 4.9 g/dL (ref 3.5–5.5)
BILIRUBIN TOTAL: 0.4 mg/dL (ref 0.0–1.2)
BUN/Creatinine Ratio: 28 — ABNORMAL HIGH (ref 9–23)
BUN: 26 mg/dL — ABNORMAL HIGH (ref 6–24)
CALCIUM: 9.9 mg/dL (ref 8.7–10.2)
CHLORIDE: 101 mmol/L (ref 96–106)
CO2: 22 mmol/L (ref 20–29)
Creatinine, Ser: 0.92 mg/dL (ref 0.57–1.00)
GFR calc Af Amer: 85 mL/min/{1.73_m2} (ref >59)
GFR calc non Af Amer: 74 mL/min/{1.73_m2} (ref >59)
Globulin, Total: 2.3 g/dL (ref 1.5–4.5)
Glucose: 117 mg/dL — ABNORMAL HIGH (ref 65–99)
POTASSIUM: 4.1 mmol/L (ref 3.5–5.2)
SODIUM: 139 mmol/L (ref 134–144)
Total Protein: 7.2 g/dL (ref 6.0–8.5)

## 2017-10-30 LAB — TSH: TSH: 2.27 u[IU]/mL (ref 0.450–4.500)

## 2017-10-30 NOTE — Telephone Encounter (Signed)
LMTCB

## 2017-10-30 NOTE — Telephone Encounter (Signed)
-----   Message from Jerrol Banana., MD sent at 10/30/2017  8:50 AM EST ----- Mildly dehydrated.

## 2017-10-30 NOTE — Telephone Encounter (Signed)
Lindsey Coleman with [facility] is requesting status update on pt surgical clearance. Pt is scheduled for surgery tomorrow 10/31/17. CB# [phone]. Please advise. Thanks TNP

## 2017-10-30 NOTE — Telephone Encounter (Signed)
Was awaiting labs and signatures from Dr. Rosanna Randy. Faxed.

## 2017-11-03 NOTE — Telephone Encounter (Signed)
Left message to call back  

## 2018-01-30 ENCOUNTER — Other Ambulatory Visit: Payer: Self-pay | Admitting: Family Medicine

## 2018-03-31 ENCOUNTER — Ambulatory Visit
Admission: RE | Admit: 2018-03-31 | Discharge: 2018-03-31 | Disposition: A | Discharge status: Home / Self Care | Payer: BC Managed Care – PPO | Source: Ambulatory Visit | Attending: Family Medicine | Admitting: Family Medicine

## 2018-03-31 ENCOUNTER — Ambulatory Visit: Payer: BC Managed Care – PPO | Admitting: Family Medicine

## 2018-03-31 VITALS — BP 112/76 | HR 62 | Temp 97.6°F | Resp 16 | Wt 142.0 lb

## 2018-03-31 DIAGNOSIS — M25562 Pain in left knee: Secondary | ICD-10-CM | POA: Insufficient documentation

## 2018-03-31 MED ORDER — NAPROXEN 500 MG PO TABS: 500.0000 mg | ORAL_TABLET | Freq: Two times a day (BID) | ORAL | 0 refills | Status: DC

## 2018-03-31 NOTE — Progress Notes (Signed)
Lindsey Coleman  MRN: [id number] DOB: [birth year]  Subjective:  HPI   The patient is a 47 year old female who presents with complaint of left knee pain.  She has history of arthroscopy of that knee.  She states that since around Dec or Jan she began having pain and swelling in the knee to the point where she said it feels like it did before she got it scoped.  Patient Active Problem List   Diagnosis Date Noted  . Irritable bowel syndrome with constipation 06/07/2017  . Allergic rhinitis 03/28/2016  . Asthma, exogenous 03/28/2016  . 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 Cyst of ovary 03/28/2016  . Flutter-fibrillation 03/28/2016  . Calculus of kidney 12/16/2015  . BP (high blood pressure) 12/16/2015    No past medical history on file.  Social History   Socioeconomic History  . Marital status: Married    Spouse name: Not on file  . Number of children: Not on file  . Years of education: Not on file  . Highest education level: Not on file  Occupational History  . Not on file  Social Needs  . Financial resource strain: Not on file  . Food insecurity:    Worry: Not on file    Inability: Not on file  . Transportation needs:    Medical: Not on file    Non-medical: Not on file  Tobacco Use  . Smoking status: Never Smoker  . Smokeless tobacco: Never Used  Substance and Sexual Activity  . Alcohol use: Yes    Alcohol/week: 0.0 oz    Comment: 1 per week  . Drug use: No  . Sexual activity: Not on file  Lifestyle  . Physical activity:    Days per week: Not on file    Minutes per session: Not on file  . Stress: Not on file  Relationships  . Social connections:    Talks on phone: Not on file    Gets together: Not on file    Attends religious service: Not on file    Active member of club or organization: Not on file    Attends meetings of clubs or  organizations: Not on file    Relationship status: Not on file  . Intimate partner violence:    Fear of current or ex partner: Not on file    Emotionally abused: Not on file    Physically abused: Not on file    Forced sexual activity: Not on file  Other Topics Concern  . Not on file  Social History Narrative  . Not on file    Outpatient Encounter Medications as of 03/31/2018  Medication Sig Note  . aspirin 81 MG tablet Take by mouth. 03/28/2016: Received from: [facility]  . Cetirizine HCl (ZYRTEC ALLERGY) 10 MG CAPS Take by mouth. 03/28/2016: Received from: [facility]  . clotrimazole (LOTRIMIN) 1 % cream Apply 1 application topically 2 (two) times daily.   . Cranberry 500 MG CAPS Take by mouth. 03/28/2016: Received from: [facility]  . cyclobenzaprine (FLEXERIL) 5 MG tablet Take 1 tablet (5 mg total) by mouth at bedtime.   . hyoscyamine (LEVSIN, ANASPAZ) 0.125 MG tablet TAKE ONE TABLET BY MOUTH EVERY 4 HOURS AS NEEDED   . levalbuterol (XOPENEX HFA) 45 MCG/ACT inhaler Inhale into the lungs. 03/28/2016: Medication taken as needed.  Received from: [facility]  . linaclotide (  LINZESS) 72 MCG capsule Take 1 capsule (72 mcg total) by mouth daily before breakfast.   . metoprolol succinate (TOPROL-XL) 25 MG 24 hr tablet TAKE 0.5 TABLETS (12.5 MG TOTAL) BY MOUTH DAILY. PT NEEDS TO SCHEDULE AN OFFICE VISIT BEFORE ANYMORE REFILLS.   Marland Kitchen mometasone (NASONEX) 50 MCG/ACT nasal spray Place 2 sprays into the nose daily.   . montelukast (SINGULAIR) 10 MG tablet Take by mouth. 03/28/2016: Received from: [facility]  . Saccharomyces boulardii (PROBIOTIC) 250 MG CAPS Take 1 capsule by mouth daily.    No facility-administered encounter medications on file as of 03/31/2018.     Allergies  Allergen Reactions  . Codeine Hives and Nausea And Vomiting    Other reaction(s): Headache, Hives  . Hydrocodone-Acetaminophen     Other  reaction(s): Headache, Hives  . Meperidine Hives and Nausea And Vomiting    Other reaction(s): Headache, Hives  . Morphine Hives and Nausea And Vomiting    Other reaction(s): Headache, Hives  . Sulfa Antibiotics Itching and Rash    Review of Systems  Constitutional: Negative.   HENT: Negative.   Eyes: Negative.   Respiratory: Negative for cough, shortness of breath and wheezing.   Cardiovascular: Negative for chest pain, palpitations and leg swelling.  Musculoskeletal: Positive for joint pain (left knee). Negative for back pain, falls, myalgias and neck pain.       Mild swelling of left knee.  Psychiatric/Behavioral: Negative.     Objective:  BP 112/76 (BP Location: Right Arm, Patient Position: Sitting, Cuff Size: Normal)   Pulse 62   Temp 97.6 F (36.4 C) (Oral)   Resp 16   Wt 142 lb (64.4 kg)   BMI 22.24 kg/m   Physical Exam  Constitutional: She is oriented to person, place, and time and well-developed, well-nourished, and in no distress.  HENT:  Head: Normocephalic and atraumatic.  Eyes: Conjunctivae are normal. No scleral icterus.  Neck: No thyromegaly present.  Cardiovascular: Normal rate, regular rhythm and normal heart sounds.  Pulmonary/Chest: Effort normal and breath sounds normal.  Abdominal: Soft.  Musculoskeletal: She exhibits no edema.  Neurological: She is alert and oriented to person, place, and time. Gait normal. GCS score is 15.  Skin: Skin is warm and dry.  Psychiatric: Mood, memory, affect and judgment normal.    Assessment and Plan :  1. Left knee pain, unspecified chronicity S/p knee arthroscopy 1991. - DG Knee Complete 4 Views Left; Future - naproxen (NAPROSYN) 500 MG tablet; Take 1 tablet (500 mg total) by mouth 2 (two) times daily with a meal.  Dispense: 30 tablet; Refill: 0 - Ambulatory referral to Orthopedic Surgery  I have done the exam and reviewed the chart and it is accurate to the best of my knowledge. Development worker, community has been used  and  any errors in dictation or transcription are unintentional. [name] M.D. [facility]

## 2018-04-01 ENCOUNTER — Telehealth: Payer: Self-pay

## 2018-04-01 NOTE — Telephone Encounter (Signed)
Advised patient of xray results. Patient would prefer that her appt be scheduled in the afternoon. Thanks!

## 2018-05-02 ENCOUNTER — Other Ambulatory Visit: Payer: Self-pay | Admitting: Family Medicine

## 2018-05-02 DIAGNOSIS — I10 Essential (primary) hypertension: Secondary | ICD-10-CM

## 2018-07-03 ENCOUNTER — Other Ambulatory Visit: Payer: Self-pay | Admitting: Physician Assistant

## 2018-07-03 DIAGNOSIS — K581 Irritable bowel syndrome with constipation: Secondary | ICD-10-CM

## 2019-05-22 ENCOUNTER — Other Ambulatory Visit: Payer: Self-pay | Admitting: Family Medicine

## 2019-05-22 DIAGNOSIS — I10 Essential (primary) hypertension: Secondary | ICD-10-CM

## 2019-06-01 ENCOUNTER — Other Ambulatory Visit: Payer: Self-pay | Admitting: Family Medicine

## 2019-06-01 DIAGNOSIS — M545 Low back pain, unspecified: Secondary | ICD-10-CM

## 2019-06-01 MED ORDER — CYCLOBENZAPRINE HCL 5 MG PO TABS: 5.0000 mg | ORAL_TABLET | Freq: Every day | ORAL | 3 refills | Status: DC

## 2019-06-01 NOTE — Telephone Encounter (Signed)
Please review. Thanks!  

## 2019-06-01 NOTE — Telephone Encounter (Signed)
Pt contacted office for refill request on the following medications:  cyclobenzaprine (FLEXERIL) 5 MG tablet  Kristopher Oppenheim  Last Rx: 03/2016 Last OV: 03/2018 Pt stated that she twisted her back and is requesting Rx. Pt was advised that she would possibly need OV or e-visit to address back pain. Pt stated that she understood and requested message be sent first. Please advise. Thanks TNP

## 2019-08-30 ENCOUNTER — Other Ambulatory Visit: Payer: Self-pay | Admitting: Family Medicine

## 2019-08-30 DIAGNOSIS — I10 Essential (primary) hypertension: Secondary | ICD-10-CM

## 2019-08-30 NOTE — Telephone Encounter (Signed)
Requested medication (s) are due for refill today: yes  Requested medication (s) are on the active medication list:yes  Last refill:  07/18/2019  Future visit scheduled: no  Notes to clinic: review for refill Overdue for office visit    Requested Prescriptions  Pending Prescriptions Disp Refills   metoprolol succinate (TOPROL-XL) 25 MG 24 hr tablet [Pharmacy Med Name: METOPROLOL SUCC ER 25 MG TAB] 15 tablet 0    Sig: TAKE 1/2 TABLET BY MOUTH DAILY     Cardiovascular:  Beta Blockers Failed - 08/30/2019  6:55 AM      Failed - Valid encounter within last 6 months    Recent Outpatient Visits          1 year ago Left knee pain, unspecified chronicity   [facility] Jerrol Banana., MD   1 year ago Pre-operative clearance   [facility] Jerrol Banana., MD   2 years ago [facility], [place], [place]   2 years ago Abdominal bloating   [facility] Jerrol Banana., MD   2 years ago Essential hypertension   [facility] Jerrol Banana., MD             Passed - Last BP in normal range    BP Readings from Last 1 Encounters:  03/31/18 112/76         Passed - Last Heart Rate in normal range    Pulse Readings from Last 1 Encounters:  03/31/18 62

## 2019-09-03 NOTE — Progress Notes (Signed)
Patient: Lindsey Coleman Female    DOB: 11-01-1970   48 y.o.   MRN: [id number] Visit Date: 09/06/2019  Today's Provider: Wilhemena Durie, MD   Chief Complaint  Patient presents with  . Follow-up  . Hypertension   Subjective:     HPI  Patient is delightful 48 year old married white female who is 1 the mother of 1 son.  He is now a Museum/gallery exhibitions officer in college, this is online due to the pandemic.  She is a Art therapist at [employer].  It has been a stressful year due to the pandemic and online teaching.  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  They are supposed to wear a mask when playing.  Emotionally she seems to be doing very well.  Blood pressures are good as far she knows. Essential hypertension From 12/16/2016-Stable. Continue current medication. Labs okay.   Allergies  Allergen Reactions  . Codeine Hives and Nausea And Vomiting    Other reaction(s): Headache, Hives  . Hydrocodone-Acetaminophen     Other reaction(s): Headache, Hives  . Meperidine Hives and Nausea And Vomiting    Other reaction(s): Headache, Hives  . Morphine Hives and Nausea And Vomiting    Other reaction(s): Headache, Hives  . Sulfa Antibiotics Itching and Rash     Current Outpatient Medications:  .  aspirin 81 MG tablet, Take by mouth., Disp: , Rfl:  .  azelastine (OPTIVAR) 0.05 % ophthalmic solution, azelastine 0.05 % eye drops, Disp: , Rfl:  .  Cetirizine HCl (ZYRTEC ALLERGY) 10 MG CAPS, Take by mouth., Disp: , Rfl:  .  Cranberry 500 MG CAPS, Take by mouth., Disp: , Rfl:  .  cyclobenzaprine (FLEXERIL) 5 MG tablet, Take 1 tablet (5 mg total) by mouth at bedtime., Disp: 30 tablet, Rfl: 3 .  hyoscyamine (LEVSIN, ANASPAZ) 0.125 MG tablet, TAKE ONE TABLET BY MOUTH EVERY 4 HOURS AS NEEDED, Disp: 60 tablet, Rfl: 4 .  ibuprofen (ADVIL) 800 MG tablet, ibuprofen 800 mg tablet, Disp: , Rfl:  .  LINZESS 72 MCG capsule, TAKE ONE CAPSULE BY MOUTH EVERY MORNING  BEFORE MEALS, Disp: 30 capsule, Rfl: 10 .  metoprolol succinate (TOPROL-XL) 25 MG 24 hr tablet, TAKE 1/2 TABLET BY MOUTH DAILY, Disp: 15 tablet, Rfl: 0 .  mometasone (NASONEX) 50 MCG/ACT nasal spray, Place 2 sprays into the nose daily., Disp: 17 g, Rfl: 12 .  montelukast (SINGULAIR) 10 MG tablet, Take by mouth., Disp: , Rfl:  .  naproxen (NAPROSYN) 500 MG tablet, Take 1 tablet (500 mg total) by mouth 2 (two) times daily with a meal., Disp: 30 tablet, Rfl: 0 .  Saccharomyces boulardii (PROBIOTIC) 250 MG CAPS, Take 1 capsule by mouth daily., Disp: 30 capsule, Rfl: 0 .  clotrimazole (LOTRIMIN) 1 % cream, Apply 1 application topically 2 (two) times daily. (Patient not taking: Reported on 09/06/2019), Disp: 30 g, Rfl: 0 .  levalbuterol (XOPENEX HFA) 45 MCG/ACT inhaler, Inhale into the lungs., Disp: , Rfl:  .  ondansetron (ZOFRAN) 4 MG tablet, ondansetron HCl 4 mg tablet, Disp: , Rfl:  .  oxyCODONE (ROXICODONE) 5 MG immediate release tablet, Roxicodone 5 mg tablet  Take one tablet po q 4-6 hours prn pain., Disp: , Rfl:  .  traMADol (ULTRAM) 50 MG tablet, tramadol 50 mg tablet, Disp: , Rfl:   Review of Systems  Constitutional: Negative for appetite change, chills, fatigue and fever.  Eyes: Negative.   Respiratory: Negative  for chest tightness and shortness of breath.   Cardiovascular: Negative for chest pain and palpitations.  Gastrointestinal: Negative for abdominal pain, nausea and vomiting.  Endocrine: Negative.   Allergic/Immunologic: Negative.   Neurological: Negative for dizziness and weakness.  Psychiatric/Behavioral: Negative.     Social History   Tobacco Use  . Smoking status: Never Smoker  . Smokeless tobacco: Never Used  Substance Use Topics  . Alcohol use: Yes    Alcohol/week: 0.0 standard drinks    Comment: 1 per week      Objective:   BP (!) 144/101 (BP Location: Left Arm, Patient Position: Sitting, Cuff Size: Large)   Pulse 89   Temp (!) 96.8 F (36 C) (Other (Comment))    Resp 16   Ht 5\' 7"  (1.702 m)   Wt 145 lb (65.8 kg)   SpO2 98%   BMI 22.71 kg/m  Vitals:   09/06/19 1327  BP: (!) 144/101  Pulse: 89  Resp: 16  Temp: (!) 96.8 F (36 C)  TempSrc: Other (Comment)  SpO2: 98%  Weight: 145 lb (65.8 kg)  Height: 5\' 7"  (1.702 m)  Body mass index is 22.71 kg/m.   Physical Exam Vitals signs reviewed.  Constitutional:      Appearance: She is well-developed.  Eyes:     Conjunctiva/sclera: Conjunctivae normal.     Pupils: Pupils are equal, round, and reactive to light.  Neck:     Musculoskeletal: Normal range of motion and neck supple.  Cardiovascular:     Rate and Rhythm: Normal rate and regular rhythm.     Heart sounds: Normal heart sounds.  Pulmonary:     Effort: Pulmonary effort is normal.     Breath sounds: Normal breath sounds.  Musculoskeletal:     Right lower leg: No edema.     Left lower leg: No edema.  Skin:    General: Skin is warm and dry.  Neurological:     General: No focal deficit present.     Mental Status: She is alert and oriented to person, place, and time.     Deep Tendon Reflexes: Reflexes are normal and symmetric.  Psychiatric:        Mood and Affect: Mood normal.        Behavior: Behavior normal.        Thought Content: Thought content normal.        Judgment: Judgment normal.      No results found for any visits on 09/06/19.     Assessment & Plan    1. Essential hypertension Controlled.  Refill meds for 1 year.  2. Need for influenza vaccination Schedule physical next summer during the summer break from school. - Flu Vaccine QUAD 6+ mos PF IM (Fluarix Quad PF)     Wilhemena Durie, MD  [facility]

## 2019-09-06 ENCOUNTER — Encounter: Payer: Self-pay | Admitting: Family Medicine

## 2019-09-06 ENCOUNTER — Other Ambulatory Visit: Payer: Self-pay

## 2019-09-06 ENCOUNTER — Ambulatory Visit: Payer: BC Managed Care – PPO | Admitting: Family Medicine

## 2019-09-06 VITALS — BP 126/83 | HR 90 | Temp 96.8°F | Resp 16 | Ht 67.0 in | Wt 145.0 lb

## 2019-09-06 DIAGNOSIS — I10 Essential (primary) hypertension: Secondary | ICD-10-CM | POA: Diagnosis not present

## 2019-09-06 DIAGNOSIS — Z23 Encounter for immunization: Secondary | ICD-10-CM | POA: Diagnosis not present

## 2019-09-21 ENCOUNTER — Other Ambulatory Visit: Payer: Self-pay | Admitting: Family Medicine

## 2019-09-21 DIAGNOSIS — I10 Essential (primary) hypertension: Secondary | ICD-10-CM

## 2019-10-08 ENCOUNTER — Other Ambulatory Visit: Payer: Self-pay | Admitting: Family Medicine

## 2019-10-08 ENCOUNTER — Encounter: Payer: Self-pay | Admitting: Family Medicine

## 2019-10-08 DIAGNOSIS — I10 Essential (primary) hypertension: Secondary | ICD-10-CM

## 2019-10-08 MED ORDER — METOPROLOL SUCCINATE ER 25 MG PO TB24: 25.0000 mg | ORAL_TABLET | Freq: Every day | ORAL | 1 refills | Status: DC

## 2019-11-28 ENCOUNTER — Ambulatory Visit: Payer: BC Managed Care – PPO | Attending: Internal Medicine

## 2019-11-28 DIAGNOSIS — Z23 Encounter for immunization: Secondary | ICD-10-CM | POA: Insufficient documentation

## 2019-11-28 NOTE — Progress Notes (Signed)
   Covid-19 Vaccination Clinic  Name:  Lindsey Coleman    MRN: [id number] DOB: 28-Aug-1971  11/28/2019  [name] was observed post Covid-19 immunization for 15 minutes without incidence. She was provided with Vaccine Information Sheet and instruction to access the V-Safe system.   [name] was instructed to call 911 with any severe reactions post vaccine: Marland Kitchen Difficulty breathing  . Swelling of your face and throat  . A fast heartbeat  . A bad rash all over your body  . Dizziness and weakness    Immunizations Administered    Name Date Dose VIS Date Route   Pfizer COVID-19 Vaccine 11/28/2019  2:53 PM 0.3 mL 09/10/2019 Intramuscular   Manufacturer: [place]   Lot: [id number]   Capac: [id number]

## 2019-12-03 NOTE — Progress Notes (Signed)
Patient: Lindsey Coleman Female    DOB: 09-02-1971   49 y.o.   MRN: [id number] Visit Date: 12/06/2019  Today's Provider: Wilhemena Durie, MD   Chief Complaint  Patient presents with  . Follow-up  . Hypertension   Subjective:     HPI  Patient is doing well.  Blood pressure is doing well as as is her IBS.  Her only complaint is right ankle stiffness when she has been seated in front of a computer for too long. Essential hypertension From 09/06/2019-Controlled.  Refilled meds for 1 year.  Allergies  Allergen Reactions  . Codeine Hives and Nausea And Vomiting    Other reaction(s): Headache, Hives  . Hydrocodone-Acetaminophen     Other reaction(s): Headache, Hives  . Meperidine Hives and Nausea And Vomiting    Other reaction(s): Headache, Hives  . Morphine Hives and Nausea And Vomiting    Other reaction(s): Headache, Hives  . Sulfa Antibiotics Itching and Rash     Current Outpatient Medications:  .  aspirin 81 MG tablet, Take by mouth., Disp: , Rfl:  .  azelastine (OPTIVAR) 0.05 % ophthalmic solution, azelastine 0.05 % eye drops, Disp: , Rfl:  .  Cetirizine HCl (ZYRTEC ALLERGY) 10 MG CAPS, Take by mouth., Disp: , Rfl:  .  Cranberry 500 MG CAPS, Take by mouth., Disp: , Rfl:  .  cyclobenzaprine (FLEXERIL) 5 MG tablet, Take 1 tablet (5 mg total) by mouth at bedtime., Disp: 30 tablet, Rfl: 3 .  hyoscyamine (LEVSIN, ANASPAZ) 0.125 MG tablet, TAKE ONE TABLET BY MOUTH EVERY 4 HOURS AS NEEDED, Disp: 60 tablet, Rfl: 4 .  ibuprofen (ADVIL) 800 MG tablet, ibuprofen 800 mg tablet, Disp: , Rfl:  .  levalbuterol (XOPENEX HFA) 45 MCG/ACT inhaler, Inhale into the lungs., Disp: , Rfl:  .  LINZESS 72 MCG capsule, TAKE ONE CAPSULE BY MOUTH EVERY MORNING BEFORE MEALS, Disp: 30 capsule, Rfl: 10 .  metoprolol succinate (TOPROL-XL) 25 MG 24 hr tablet, Take 1 tablet (25 mg total) by mouth daily., Disp: 90 tablet, Rfl: 1 .  mometasone (NASONEX) 50 MCG/ACT nasal spray, Place 2 sprays into the  nose daily., Disp: 17 g, Rfl: 12 .  montelukast (SINGULAIR) 10 MG tablet, Take by mouth., Disp: , Rfl:  .  naproxen (NAPROSYN) 500 MG tablet, Take 1 tablet (500 mg total) by mouth 2 (two) times daily with a meal., Disp: 30 tablet, Rfl: 0 .  Saccharomyces boulardii (PROBIOTIC) 250 MG CAPS, Take 1 capsule by mouth daily., Disp: 30 capsule, Rfl: 0 .  clotrimazole (LOTRIMIN) 1 % cream, Apply 1 application topically 2 (two) times daily. (Patient not taking: Reported on 09/06/2019), Disp: 30 g, Rfl: 0 .  ondansetron (ZOFRAN) 4 MG tablet, ondansetron HCl 4 mg tablet, Disp: , Rfl:  .  oxyCODONE (ROXICODONE) 5 MG immediate release tablet, Roxicodone 5 mg tablet  Take one tablet po q 4-6 hours prn pain., Disp: , Rfl:  .  traMADol (ULTRAM) 50 MG tablet, tramadol 50 mg tablet, Disp: , Rfl:   Review of Systems  Constitutional: Negative for appetite change, chills, fatigue and fever.  HENT: Negative.   Eyes: Negative.   Respiratory: Negative for chest tightness and shortness of breath.   Cardiovascular: Negative for chest pain and palpitations.  Gastrointestinal: Negative for abdominal pain, nausea and vomiting.  Musculoskeletal: Positive for arthralgias.  Allergic/Immunologic: Negative.   Neurological: Negative for dizziness and weakness.  Hematological: Negative.   Psychiatric/Behavioral: Negative.     Social  History   Tobacco Use  . Smoking status: Never Smoker  . Smokeless tobacco: Never Used  Substance Use Topics  . Alcohol use: Yes    Alcohol/week: 0.0 standard drinks    Comment: 1 per week      Objective:   BP 132/78 (BP Location: Left Arm, Patient Position: Sitting, Cuff Size: Large)   Pulse 72   Temp (!) 97.1 F (36.2 C) (Other (Comment))   Resp 16   Ht 5\' 7"  (1.702 m)   Wt 148 lb (67.1 kg)   SpO2 97%   BMI 23.18 kg/m  Vitals:   12/06/19 1337  BP: 132/78  Pulse: 72  Resp: 16  Temp: (!) 97.1 F (36.2 C)  TempSrc: Other (Comment)  SpO2: 97%  Weight: 148 lb (67.1 kg)    Height: 5\' 7"  (1.702 m)  Body mass index is 23.18 kg/m.   Physical Exam Vitals reviewed.  Constitutional:      Appearance: She is well-developed.  Eyes:     Conjunctiva/sclera: Conjunctivae normal.     Pupils: Pupils are equal, round, and reactive to light.  Cardiovascular:     Rate and Rhythm: Normal rate and regular rhythm.     Heart sounds: Normal heart sounds.  Pulmonary:     Effort: Pulmonary effort is normal.     Breath sounds: Normal breath sounds.  Abdominal:     Palpations: Abdomen is soft.  Musculoskeletal:     Cervical back: Normal range of motion and neck supple.     Right lower leg: No edema.     Left lower leg: No edema.     Comments: Exam of her ankles is normal.  Skin:    General: Skin is warm and dry.  Neurological:     General: No focal deficit present.     Mental Status: She is alert and oriented to person, place, and time.     Deep Tendon Reflexes: Reflexes are normal and symmetric.  Psychiatric:        Mood and Affect: Mood normal.        Behavior: Behavior normal.        Thought Content: Thought content normal.        Judgment: Judgment normal.      No results found for any visits on 12/06/19.     Assessment & Plan    1. Essential hypertension On metoprolol 25 mg daily  2. Allergic rhinitis due to other allergic trigger, unspecified seasonality On montelukast, Zyrtec, Nasonex nasal spray.  3. Mild intermittent extrinsic asthma without complication On as needed Xopenex HFA  4. Irritable bowel syndrome with constipation Controlled on Linzess. 5.  Ankle pain Try stretches.  I do not think she needs imaging or referral at this time.  No other treatment other than possibly reaching out to her athletic trainer at work.   Follow up in June for CPE.      I,Mattilynn Forrer,acting as a scribe for Wilhemena Durie, MD.,have documented all relevant documentation on the behalf of Wilhemena Durie, MD,as directed by  Wilhemena Durie, MD  while in the presence of Wilhemena Durie, MD.      Wilhemena Durie, MD  [facility]

## 2019-12-06 ENCOUNTER — Other Ambulatory Visit: Payer: Self-pay

## 2019-12-06 ENCOUNTER — Ambulatory Visit: Payer: BC Managed Care – PPO | Admitting: Family Medicine

## 2019-12-06 ENCOUNTER — Encounter: Payer: Self-pay | Admitting: Family Medicine

## 2019-12-06 VITALS — BP 132/78 | HR 72 | Temp 97.1°F | Resp 16 | Ht 67.0 in | Wt 148.0 lb

## 2019-12-06 DIAGNOSIS — I10 Essential (primary) hypertension: Secondary | ICD-10-CM

## 2019-12-06 DIAGNOSIS — M25571 Pain in right ankle and joints of right foot: Secondary | ICD-10-CM

## 2019-12-06 DIAGNOSIS — J3089 Other allergic rhinitis: Secondary | ICD-10-CM | POA: Diagnosis not present

## 2019-12-06 DIAGNOSIS — J4520 Mild intermittent asthma, uncomplicated: Secondary | ICD-10-CM | POA: Diagnosis not present

## 2019-12-06 DIAGNOSIS — G8929 Other chronic pain: Secondary | ICD-10-CM

## 2019-12-06 DIAGNOSIS — K581 Irritable bowel syndrome with constipation: Secondary | ICD-10-CM

## 2019-12-21 ENCOUNTER — Ambulatory Visit: Payer: BC Managed Care – PPO | Attending: Internal Medicine

## 2019-12-21 DIAGNOSIS — Z23 Encounter for immunization: Secondary | ICD-10-CM

## 2019-12-21 NOTE — Progress Notes (Signed)
   Covid-19 Vaccination Clinic  Name:  Lindsey Coleman    MRN: [id number] DOB: 07/22/1971  12/21/2019  Ms. [name] was observed post Covid-19 immunization for 15 minutes without incident. She was provided with Vaccine Information Sheet and instruction to access the V-Safe system.   Ms. [name] was instructed to call 911 with any severe reactions post vaccine: Marland Kitchen Difficulty breathing  . Swelling of face and throat  . A fast heartbeat  . A bad rash all over body  . Dizziness and weakness   Immunizations Administered    Name Date Dose VIS Date Route   Pfizer COVID-19 Vaccine 12/21/2019  1:53 PM 0.3 mL 09/10/2019 Intramuscular   Manufacturer: [employer], [employer]   Lot: [id number]   [place]: [id number]

## 2020-02-22 NOTE — Progress Notes (Signed)
Established patient visit  I,[name],acting as a scribe for Lindsey Durie, MD.,have documented all relevant documentation on the behalf of Lindsey Durie, MD,as directed by  Lindsey Durie, MD while in the presence of Lindsey Durie, MD.   Patient: Lindsey Coleman   DOB: Aug 23, 1971   49 y.o. Female  MRN: [id number] Visit Date: 02/23/2020  Today's healthcare provider: Wilhemena Durie, MD   Chief Complaint  Patient presents with  . Annual Exam  . Foot Pain   Subjective    HPI  Patient comes in for today for annual physical.  She last had a physical for Dr. Ammie Coleman who retired about 4 years ago. She is status post complete hysterectomy and has never had an abnormal Pap. She also has complained of right medial ankle pain. Patient states she has had right ankle pain for 2 weeks. Pain in and around ankles. Patient also radiates up into the right calf. Patient has been been treating pain with ice, Tylenol, and ibuprofen with mild relief. Patient states there is slight swelling in the evenings in the right ankle. No injury mechanism.       Medications: Outpatient Medications Prior to Visit  Medication Sig  . aspirin 81 MG tablet Take by mouth.  Marland Kitchen azelastine (OPTIVAR) 0.05 % ophthalmic solution azelastine 0.05 % eye drops  . Cetirizine HCl (ZYRTEC ALLERGY) 10 MG CAPS Take by mouth.  . Cranberry 500 MG CAPS Take by mouth.  . cyclobenzaprine (FLEXERIL) 5 MG tablet Take 1 tablet (5 mg total) by mouth at bedtime.  . hyoscyamine (LEVSIN, ANASPAZ) 0.125 MG tablet TAKE ONE TABLET BY MOUTH EVERY 4 HOURS AS NEEDED  . ibuprofen (ADVIL) 800 MG tablet ibuprofen 800 mg tablet  . levalbuterol (XOPENEX HFA) 45 MCG/ACT inhaler Inhale into the lungs.  Marland Kitchen LINZESS 72 MCG capsule TAKE ONE CAPSULE BY MOUTH EVERY MORNING BEFORE MEALS  . metoprolol succinate (TOPROL-XL) 25 MG 24 hr tablet Take 1 tablet (25 mg total) by mouth daily.  . mometasone (NASONEX) 50 MCG/ACT nasal spray  Place 2 sprays into the nose daily.  . montelukast (SINGULAIR) 10 MG tablet Take by mouth.  . naproxen (NAPROSYN) 500 MG tablet Take 1 tablet (500 mg total) by mouth 2 (two) times daily with a meal.  . Saccharomyces boulardii (PROBIOTIC) 250 MG CAPS Take 1 capsule by mouth daily.  . clotrimazole (LOTRIMIN) 1 % cream Apply 1 application topically 2 (two) times daily. (Patient not taking: Reported on 09/06/2019)  . ondansetron (ZOFRAN) 4 MG tablet ondansetron HCl 4 mg tablet  . oxyCODONE (ROXICODONE) 5 MG immediate release tablet Roxicodone 5 mg tablet  Take one tablet po q 4-6 hours prn pain.  . traMADol (ULTRAM) 50 MG tablet tramadol 50 mg tablet   No facility-administered medications prior to visit.    Review of Systems  Constitutional: Negative for appetite change, chills, fatigue and fever.  HENT: Negative.   Eyes: Negative.   Respiratory: Negative for chest tightness and shortness of breath.   Cardiovascular: Negative for chest pain and palpitations.  Gastrointestinal: Negative for abdominal pain, nausea and vomiting.  Endocrine: Negative.   Genitourinary: Negative.   Musculoskeletal: Positive for arthralgias.  Allergic/Immunologic: Negative.   Neurological: Negative for dizziness and weakness.  Hematological: Negative.   Psychiatric/Behavioral: Negative.        Objective    BP (!) 132/91 (BP Location: Left Arm, Patient Position: Sitting, Cuff Size: Large)   Pulse 87   Temp Marland Kitchen)  96.9 F (36.1 C) (Other (Comment))   Resp 16   Ht 5\' 7"  (1.702 m)   Wt 143 lb (64.9 kg)   SpO2 97%   BMI 22.40 kg/m     Physical Exam Vitals reviewed.  Constitutional:      Appearance: She is well-developed.  HENT:     Right Ear: Tympanic membrane and external ear normal.     Left Ear: Tympanic membrane and external ear normal.     Mouth/Throat:     Pharynx: Oropharynx is clear.  Eyes:     General: No scleral icterus.    Conjunctiva/sclera: Conjunctivae normal.     Pupils: Pupils are  equal, round, and reactive to light.  Cardiovascular:     Rate and Rhythm: Normal rate and regular rhythm.     Heart sounds: Normal heart sounds.  Pulmonary:     Effort: Pulmonary effort is normal.     Breath sounds: Normal breath sounds.  Chest:     Breasts:        Right: Normal.        Left: Normal.  Abdominal:     Palpations: Abdomen is soft.  Genitourinary:    General: Normal vulva.     Comments: Bimanual exam is benign. Musculoskeletal:     Cervical back: Normal range of motion and neck supple.     Right lower leg: No edema.     Left lower leg: No edema.     Comments: She has no swelling of the right ankle but has very mild  tenderness inferior to the medial malleolus.  Ankle itself is stable.  Skin:    General: Skin is warm and dry.  Neurological:     General: No focal deficit present.     Mental Status: She is alert and oriented to person, place, and time.     Deep Tendon Reflexes: Reflexes are normal and symmetric.  Psychiatric:        Mood and Affect: Mood normal.        Behavior: Behavior normal.        Thought Content: Thought content normal.        Judgment: Judgment normal.       No results found for any visits on 02/23/20.  Assessment & Plan     1. Annual physical exam  - CBC w/Diff/Platelet - Comprehensive Metabolic Panel (CMET) - TSH  2. Type 2 diabetes mellitus without complication, without long-term current use of insulin (HCC)  - CBC w/Diff/Platelet - Comprehensive Metabolic Panel (CMET) - TSH  3. Essential hypertension  - CBC w/Diff/Platelet - Comprehensive Metabolic Panel (CMET) - TSH  4. Flutter-fibrillation (HCC)  - CBC w/Diff/Platelet - Comprehensive Metabolic Panel (CMET) - TSH  5. Mild intermittent extrinsic asthma without complication  - CBC w/Diff/Platelet - Comprehensive Metabolic Panel (CMET) - TSH  6. Allergic rhinitis due to other allergic trigger, unspecified seasonality  - CBC w/Diff/Platelet - Comprehensive  Metabolic Panel (CMET) - TSH  7. Screening for colon cancer  - Ambulatory referral to Gastroenterology 8.  Right ankle pain I think this is a mild sprain versus tendinitis.  Offered x-ray but I do not think she needs it.  Will treat with naproxen for 2 weeks and a support sleeve.  No follow-ups on file.      I, Lindsey Durie, MD, have reviewed all documentation for this visit. The documentation on 02/27/20 for the exam, diagnosis, procedures, and orders are all accurate and complete.    Jamesia Linnen Rosanna Randy  [place]oke Bon[place], [place] [phone] (phone) [phone] (fax)  [place]

## 2020-02-23 ENCOUNTER — Other Ambulatory Visit: Payer: Self-pay

## 2020-02-23 ENCOUNTER — Ambulatory Visit: Payer: BC Managed Care – PPO | Admitting: Family Medicine

## 2020-02-23 ENCOUNTER — Encounter: Payer: Self-pay | Admitting: Family Medicine

## 2020-02-23 VITALS — BP 132/91 | HR 87 | Temp 96.9°F | Resp 16 | Ht 67.0 in | Wt 143.0 lb

## 2020-02-23 DIAGNOSIS — E119 Type 2 diabetes mellitus without complications: Secondary | ICD-10-CM | POA: Diagnosis not present

## 2020-02-23 DIAGNOSIS — I4891 Unspecified atrial fibrillation: Secondary | ICD-10-CM

## 2020-02-23 DIAGNOSIS — I4892 Unspecified atrial flutter: Secondary | ICD-10-CM

## 2020-02-23 DIAGNOSIS — J3089 Other allergic rhinitis: Secondary | ICD-10-CM

## 2020-02-23 DIAGNOSIS — M25571 Pain in right ankle and joints of right foot: Secondary | ICD-10-CM

## 2020-02-23 DIAGNOSIS — I10 Essential (primary) hypertension: Secondary | ICD-10-CM | POA: Diagnosis not present

## 2020-02-23 DIAGNOSIS — IMO0002 Reserved for concepts with insufficient information to code with codable children: Secondary | ICD-10-CM

## 2020-02-23 DIAGNOSIS — Z0000 Encounter for general adult medical examination without abnormal findings: Secondary | ICD-10-CM | POA: Diagnosis not present

## 2020-02-23 DIAGNOSIS — J4520 Mild intermittent asthma, uncomplicated: Secondary | ICD-10-CM | POA: Diagnosis not present

## 2020-02-23 DIAGNOSIS — Z1211 Encounter for screening for malignant neoplasm of colon: Secondary | ICD-10-CM

## 2020-02-23 NOTE — Patient Instructions (Signed)
For foot and ankle pain take Naproxen 1-2 weeks.

## 2020-02-24 ENCOUNTER — Telehealth: Payer: Self-pay

## 2020-02-24 LAB — CBC WITH DIFFERENTIAL/PLATELET
Basophils Absolute: 0 10*3/uL (ref 0.0–0.2)
Basos: 0 %
EOS (ABSOLUTE): 0.3 10*3/uL (ref 0.0–0.4)
Eos: 5 %
Hematocrit: 40.8 % (ref 34.0–46.6)
Hemoglobin: 14.1 g/dL (ref 11.1–15.9)
Immature Grans (Abs): 0 10*3/uL (ref 0.0–0.1)
Immature Granulocytes: 0 %
Lymphocytes Absolute: 2.2 10*3/uL (ref 0.7–3.1)
Lymphs: 31 %
MCH: 30.6 pg (ref 26.6–33.0)
MCHC: 34.6 g/dL (ref 31.5–35.7)
MCV: 89 fL (ref 79–97)
Monocytes Absolute: 0.4 10*3/uL (ref 0.1–0.9)
Monocytes: 6 %
Neutrophils Absolute: 4 10*3/uL (ref 1.4–7.0)
Neutrophils: 58 %
Platelets: 256 10*3/uL (ref 150–450)
RBC: 4.61 x10E6/uL (ref 3.77–5.28)
RDW: 12.5 % (ref 11.7–15.4)
WBC: 6.9 10*3/uL (ref 3.4–10.8)

## 2020-02-24 LAB — COMPREHENSIVE METABOLIC PANEL
ALT: 11 IU/L (ref 0–32)
AST: 17 IU/L (ref 0–40)
Albumin/Globulin Ratio: 2.2 (ref 1.2–2.2)
Albumin: 4.8 g/dL (ref 3.8–4.8)
Alkaline Phosphatase: 64 IU/L (ref 48–121)
BUN/Creatinine Ratio: 24 — ABNORMAL HIGH (ref 9–23)
BUN: 24 mg/dL (ref 6–24)
Bilirubin Total: 0.3 mg/dL (ref 0.0–1.2)
CO2: 24 mmol/L (ref 20–29)
Calcium: 9.8 mg/dL (ref 8.7–10.2)
Chloride: 100 mmol/L (ref 96–106)
Creatinine, Ser: 1.02 mg/dL — ABNORMAL HIGH (ref 0.57–1.00)
GFR calc Af Amer: 74 mL/min/{1.73_m2} (ref >59)
GFR calc non Af Amer: 64 mL/min/{1.73_m2} (ref >59)
Globulin, Total: 2.2 g/dL (ref 1.5–4.5)
Glucose: 93 mg/dL (ref 65–99)
Potassium: 4 mmol/L (ref 3.5–5.2)
Sodium: 140 mmol/L (ref 134–144)
Total Protein: 7 g/dL (ref 6.0–8.5)

## 2020-02-24 LAB — TSH: TSH: 1.91 u[IU]/mL (ref 0.450–4.500)

## 2020-02-24 NOTE — Telephone Encounter (Signed)
-----   Message from Jerrol Banana., MD sent at 02/24/2020 11:06 AM EDT ----- Labs stable, mildly dehydrated.  Push fluids during the day.

## 2020-02-24 NOTE — Telephone Encounter (Signed)
Patient given lab results through mychart and patient has read the provider's comments.  

## 2020-02-25 ENCOUNTER — Other Ambulatory Visit: Payer: Self-pay | Admitting: Family Medicine

## 2020-02-25 DIAGNOSIS — Z1231 Encounter for screening mammogram for malignant neoplasm of breast: Secondary | ICD-10-CM

## 2020-03-08 ENCOUNTER — Telehealth (INDEPENDENT_AMBULATORY_CARE_PROVIDER_SITE_OTHER): Payer: Self-pay | Admitting: Gastroenterology

## 2020-03-08 ENCOUNTER — Other Ambulatory Visit: Payer: Self-pay

## 2020-03-08 DIAGNOSIS — Z1211 Encounter for screening for malignant neoplasm of colon: Secondary | ICD-10-CM

## 2020-03-08 NOTE — Progress Notes (Signed)
Gastroenterology Pre-Procedure Review  Request Date: Monday 04/10/20 Requesting Physician: Dr. Allen Norris  PATIENT REVIEW QUESTIONS: The patient responded to the following health history questions as indicated:    1. Are you having any GI issues? no 2. Do you have a personal history of Polyps? no 3. Do you have a family history of Colon Cancer or Polyps? No, however mother and grandmother both had diverticulitis 4. Diabetes Mellitus? no 5. Joint replacements in the past 12 months?no 6. Major health problems in the past 3 months?no 7. Any artificial heart valves, MVP, or defibrillator?no    MEDICATIONS & ALLERGIES:    Patient reports the following regarding taking any anticoagulation/antiplatelet therapy:   Plavix, Coumadin, Eliquis, Xarelto, Lovenox, Pradaxa, Brilinta, or Effient? no Aspirin? Yes 81 mg daily  Patient confirms/reports the following medications:  Current Outpatient Medications  Medication Sig Dispense Refill  . aspirin 81 MG tablet Take by mouth.    Marland Kitchen azelastine (OPTIVAR) 0.05 % ophthalmic solution azelastine 0.05 % eye drops    . Cetirizine HCl (ZYRTEC ALLERGY) 10 MG CAPS Take by mouth.    . Cranberry 500 MG CAPS Take by mouth.    . cyclobenzaprine (FLEXERIL) 5 MG tablet Take 1 tablet (5 mg total) by mouth at bedtime. 30 tablet 3  . hyoscyamine (LEVSIN, ANASPAZ) 0.125 MG tablet TAKE ONE TABLET BY MOUTH EVERY 4 HOURS AS NEEDED 60 tablet 4  . ibuprofen (ADVIL) 800 MG tablet ibuprofen 800 mg tablet    . levalbuterol (XOPENEX HFA) 45 MCG/ACT inhaler Inhale into the lungs.    Marland Kitchen LINZESS 72 MCG capsule TAKE ONE CAPSULE BY MOUTH EVERY MORNING BEFORE MEALS 30 capsule 10  . metoprolol succinate (TOPROL-XL) 25 MG 24 hr tablet Take 1 tablet (25 mg total) by mouth daily. 90 tablet 1  . mometasone (NASONEX) 50 MCG/ACT nasal spray Place 2 sprays into the nose daily. 17 g 12  . montelukast (SINGULAIR) 10 MG tablet Take by mouth.    . naproxen (NAPROSYN) 500 MG tablet Take 1 tablet (500  mg total) by mouth 2 (two) times daily with a meal. 30 tablet 0  . Saccharomyces boulardii (PROBIOTIC) 250 MG CAPS Take 1 capsule by mouth daily. 30 capsule 0  . clotrimazole (LOTRIMIN) 1 % cream Apply 1 application topically 2 (two) times daily. (Patient not taking: Reported on 09/06/2019) 30 g 0  . ondansetron (ZOFRAN) 4 MG tablet ondansetron HCl 4 mg tablet    . oxyCODONE (ROXICODONE) 5 MG immediate release tablet Roxicodone 5 mg tablet  Take one tablet po q 4-6 hours prn pain.    . traMADol (ULTRAM) 50 MG tablet tramadol 50 mg tablet     No current facility-administered medications for this visit.    Patient confirms/reports the following allergies:  Allergies  Allergen Reactions  . Codeine Hives and Nausea And Vomiting    Other reaction(s): Headache, Hives  . Hydrocodone-Acetaminophen     Other reaction(s): Headache, Hives  . Meperidine Hives and Nausea And Vomiting    Other reaction(s): Headache, Hives  . Morphine Hives and Nausea And Vomiting    Other reaction(s): Headache, Hives  . Sulfa Antibiotics Itching and Rash    No orders of the defined types were placed in this encounter.   AUTHORIZATION INFORMATION Primary Insurance: 1D#: Group #:  Secondary Insurance: 1D#: Group #:  SCHEDULE INFORMATION: Date:  Time: Location:

## 2020-03-09 ENCOUNTER — Ambulatory Visit
Admission: RE | Admit: 2020-03-09 | Discharge: 2020-03-09 | Disposition: A | Discharge status: Home / Self Care | Payer: BC Managed Care – PPO | Source: Ambulatory Visit | Attending: Family Medicine | Admitting: Family Medicine

## 2020-03-09 DIAGNOSIS — Z1231 Encounter for screening mammogram for malignant neoplasm of breast: Secondary | ICD-10-CM | POA: Insufficient documentation

## 2020-03-15 ENCOUNTER — Inpatient Hospital Stay
Admission: RE | Admit: 2020-03-15 | Discharge: 2020-03-15 | Disposition: A | Discharge status: Home / Self Care | Payer: Self-pay | Source: Ambulatory Visit | Attending: *Deleted | Admitting: *Deleted

## 2020-03-15 ENCOUNTER — Other Ambulatory Visit: Payer: Self-pay | Admitting: *Deleted

## 2020-03-15 DIAGNOSIS — Z1231 Encounter for screening mammogram for malignant neoplasm of breast: Secondary | ICD-10-CM

## 2020-03-23 ENCOUNTER — Encounter: Payer: Self-pay | Admitting: Family Medicine

## 2020-04-04 ENCOUNTER — Other Ambulatory Visit: Payer: Self-pay

## 2020-04-04 ENCOUNTER — Encounter: Payer: Self-pay | Admitting: Gastroenterology

## 2020-04-06 ENCOUNTER — Other Ambulatory Visit: Payer: Self-pay

## 2020-04-06 ENCOUNTER — Other Ambulatory Visit
Admission: RE | Admit: 2020-04-06 | Discharge: 2020-04-06 | Disposition: A | Discharge status: Home / Self Care | Payer: BC Managed Care – PPO | Source: Ambulatory Visit | Attending: Gastroenterology | Admitting: Gastroenterology

## 2020-04-06 DIAGNOSIS — Z01812 Encounter for preprocedural laboratory examination: Secondary | ICD-10-CM | POA: Diagnosis not present

## 2020-04-06 DIAGNOSIS — Z20822 Contact with and (suspected) exposure to covid-19: Secondary | ICD-10-CM | POA: Diagnosis not present

## 2020-04-06 LAB — SARS CORONAVIRUS 2 (TAT 6-24 HRS): SARS Coronavirus 2: NEGATIVE

## 2020-04-07 NOTE — Discharge Instructions (Signed)
General Anesthesia, Adult, Care After This sheet gives you information about how to care for yourself after your procedure. Your health care provider may also give you more specific instructions. If you have problems or questions, contact your health care provider. What can I expect after the procedure? After the procedure, the following side effects are common:  Pain or discomfort at the IV site.  Nausea.  Vomiting.  Sore throat.  Trouble concentrating.  Feeling cold or chills.  Weak or tired.  Sleepiness and fatigue.  Soreness and body aches. These side effects can affect parts of the body that were not involved in surgery. Follow these instructions at home:  For at least 24 hours after the procedure:  Have a responsible adult stay with you. It is important to have someone help care for you until you are awake and alert.  Rest as needed.  Do not: ? Participate in activities in which you could fall or become injured. ? Drive. ? Use heavy machinery. ? Drink alcohol. ? Take sleeping pills or medicines that cause drowsiness. ? Make important decisions or sign legal documents. ? Take care of children on your own. Eating and drinking  Follow any instructions from your health care provider about eating or drinking restrictions.  When you feel hungry, start by eating small amounts of foods that are soft and easy to digest (bland), such as toast. Gradually return to your regular diet.  Drink enough fluid to keep your urine pale yellow.  If you vomit, rehydrate by drinking water, juice, or clear broth. General instructions  If you have sleep apnea, surgery and certain medicines can increase your risk for breathing problems. Follow instructions from your health care provider about wearing your sleep device: ? Anytime you are sleeping, including during daytime naps. ? While taking prescription pain medicines, sleeping medicines, or medicines that make you drowsy.  Return to  your normal activities as told by your health care provider. Ask your health care provider what activities are safe for you.  Take over-the-counter and prescription medicines only as told by your health care provider.  If you smoke, do not smoke without supervision.  Keep all follow-up visits as told by your health care provider. This is important. Contact a health care provider if:  You have nausea or vomiting that does not get better with medicine.  You cannot eat or drink without vomiting.  You have pain that does not get better with medicine.  You are unable to pass urine.  You develop a skin rash.  You have a fever.  You have redness around your IV site that gets worse. Get help right away if:  You have difficulty breathing.  You have chest pain.  You have blood in your urine or stool, or you vomit blood. Summary  After the procedure, it is common to have a sore throat or nausea. It is also common to feel tired.  Have a responsible adult stay with you for the first 24 hours after general anesthesia. It is important to have someone help care for you until you are awake and alert.  When you feel hungry, start by eating small amounts of foods that are soft and easy to digest (bland), such as toast. Gradually return to your regular diet.  Drink enough fluid to keep your urine pale yellow.  Return to your normal activities as told by your health care provider. Ask your health care provider what activities are safe for you. This information is not   intended to replace advice given to you by your health care provider. Make sure you discuss any questions you have with your health care provider. Document Revised: 09/19/2017 Document Reviewed: 05/02/2017 [employer]vier Patient Education  2020 [employer]  

## 2020-04-10 ENCOUNTER — Ambulatory Visit: Payer: BC Managed Care – PPO | Admitting: Anesthesiology

## 2020-04-10 ENCOUNTER — Ambulatory Visit
Admission: RE | Admit: 2020-04-10 | Discharge: 2020-04-10 | Disposition: A | Discharge status: Home / Self Care | Payer: BC Managed Care – PPO | Attending: Gastroenterology | Admitting: Gastroenterology

## 2020-04-10 ENCOUNTER — Other Ambulatory Visit: Payer: Self-pay

## 2020-04-10 ENCOUNTER — Other Ambulatory Visit: Payer: Self-pay | Admitting: Gastroenterology

## 2020-04-10 ENCOUNTER — Encounter: Payer: Self-pay | Admitting: Gastroenterology

## 2020-04-10 ENCOUNTER — Encounter
Admission: RE | Disposition: A | Discharge status: Home / Self Care | Payer: Self-pay | Source: Home / Self Care | Attending: Gastroenterology

## 2020-04-10 DIAGNOSIS — D124 Benign neoplasm of descending colon: Secondary | ICD-10-CM | POA: Diagnosis not present

## 2020-04-10 DIAGNOSIS — Z8673 Personal history of transient ischemic attack (TIA), and cerebral infarction without residual deficits: Secondary | ICD-10-CM | POA: Diagnosis not present

## 2020-04-10 DIAGNOSIS — Z1211 Encounter for screening for malignant neoplasm of colon: Secondary | ICD-10-CM

## 2020-04-10 DIAGNOSIS — I341 Nonrheumatic mitral (valve) prolapse: Secondary | ICD-10-CM | POA: Diagnosis not present

## 2020-04-10 DIAGNOSIS — D125 Benign neoplasm of sigmoid colon: Secondary | ICD-10-CM | POA: Insufficient documentation

## 2020-04-10 DIAGNOSIS — Z7982 Long term (current) use of aspirin: Secondary | ICD-10-CM | POA: Insufficient documentation

## 2020-04-10 DIAGNOSIS — K635 Polyp of colon: Secondary | ICD-10-CM | POA: Diagnosis not present

## 2020-04-10 DIAGNOSIS — J45909 Unspecified asthma, uncomplicated: Secondary | ICD-10-CM | POA: Diagnosis not present

## 2020-04-10 DIAGNOSIS — I10 Essential (primary) hypertension: Secondary | ICD-10-CM | POA: Diagnosis not present

## 2020-04-10 HISTORY — DX: Family history of other specified conditions: Z84.89

## 2020-04-10 HISTORY — DX: Other specified postprocedural states: R11.2

## 2020-04-10 HISTORY — DX: Other specified postprocedural states: Z98.890

## 2020-04-10 HISTORY — PX: POLYPECTOMY: SHX5525

## 2020-04-10 HISTORY — DX: Essential (primary) hypertension: I10

## 2020-04-10 HISTORY — DX: Nonrheumatic mitral (valve) prolapse: I34.1

## 2020-04-10 HISTORY — DX: Unspecified asthma, uncomplicated: J45.909

## 2020-04-10 HISTORY — PX: COLONOSCOPY WITH PROPOFOL: SHX5780

## 2020-04-10 HISTORY — DX: Presence of spectacles and contact lenses: Z97.3

## 2020-04-10 SURGERY — COLONOSCOPY WITH PROPOFOL
Anesthesia: General | Site: Rectum

## 2020-04-10 MED ORDER — ACETAMINOPHEN 160 MG/5ML PO SOLN: 325.0000 mg | Freq: Once | ORAL | Status: DC

## 2020-04-10 MED ORDER — PROPOFOL 10 MG/ML IV BOLUS
INTRAVENOUS | Status: DC | PRN
  Administered 2020-04-10: 30 mg via INTRAVENOUS
  Administered 2020-04-10: 140 mg via INTRAVENOUS
  Administered 2020-04-10: 30 mg via INTRAVENOUS
  Administered 2020-04-10 (×2): 40 mg via INTRAVENOUS
  Administered 2020-04-10: 30 mg via INTRAVENOUS

## 2020-04-10 MED ORDER — STERILE WATER FOR IRRIGATION IR SOLN
Status: DC | PRN
  Administered 2020-04-10: 50 mL

## 2020-04-10 MED ORDER — ACETAMINOPHEN 325 MG PO TABS: 325.0000 mg | ORAL_TABLET | Freq: Once | ORAL | Status: DC

## 2020-04-10 MED ORDER — LACTATED RINGERS IV SOLN: INTRAVENOUS | Status: DC

## 2020-04-10 MED ORDER — LIDOCAINE HCL (CARDIAC) PF 100 MG/5ML IV SOSY
PREFILLED_SYRINGE | INTRAVENOUS | Status: DC | PRN
  Administered 2020-04-10: 40 mg via INTRAVENOUS

## 2020-04-10 MED ORDER — SODIUM CHLORIDE 0.9 % IV SOLN: INTRAVENOUS | Status: DC

## 2020-04-10 SURGICAL SUPPLY — 6 items
FORCEPS BIOP RAD 4 LRG CAP 4 (CUTTING FORCEPS) ×3 IMPLANT
GOWN CVR UNV OPN BCK APRN NK (MISCELLANEOUS) ×2 IMPLANT
GOWN ISOL THUMB LOOP REG UNIV (MISCELLANEOUS) ×4
KIT ENDO PROCEDURE OLY (KITS) ×3 IMPLANT
MANIFOLD NEPTUNE II (INSTRUMENTS) ×3 IMPLANT
WATER STERILE IRR 250ML POUR (IV SOLUTION) ×3 IMPLANT

## 2020-04-10 NOTE — Op Note (Signed)
[facility] Patient Name: Lindsey Coleman Procedure Date: 04/10/2020 8:43 AM MRN: [id number] Account #: [id number] Date of Birth: [birth year] Admit Type: Outpatient Age: 49 Room: [facility] OR ROOM 01 Gender: Female Note Status: Finalized Procedure:             Colonoscopy Indications:           Screening for colorectal malignant neoplasm Providers:             Lucilla Lame MD, MD Referring MD:          Janine Ores. Rosanna Randy, MD (Referring MD) Medicines:             Propofol per Anesthesia Complications:         No immediate complications. Procedure:             Pre-Anesthesia Assessment:                        - Prior to the procedure, a History and Physical was                         performed, and patient medications and allergies were                         reviewed. The patient's tolerance of previous                         anesthesia was also reviewed. The risks and benefits                         of the procedure and the sedation options and risks                         were discussed with the patient. All questions were                         answered, and informed consent was obtained. Prior                         Anticoagulants: The patient has taken no previous                         anticoagulant or antiplatelet agents. ASA Grade                         Assessment: II - A patient with mild systemic disease.                         After reviewing the risks and benefits, the patient                         was deemed in satisfactory condition to undergo the                         procedure.                        After obtaining informed consent, the colonoscope was  passed under direct vision. Throughout the procedure,                         the patient's blood pressure, pulse, and oxygen                         saturations were monitored continuously. The was                         introduced through the anus and  advanced to the the                         cecum, identified by appendiceal orifice and ileocecal                         valve. The colonoscopy was performed without                         difficulty. The patient tolerated the procedure well.                         The quality of the bowel preparation was excellent. Findings:      The perianal and digital rectal examinations were normal.      A 2 mm polyp was found in the sigmoid colon. The polyp was sessile. The       polyp was removed with a cold biopsy forceps. Resection and retrieval       were complete.      A 3 mm polyp was found in the descending colon. The polyp was sessile.       The polyp was removed with a cold biopsy forceps. Resection and       retrieval were complete. Impression:            - One 2 mm polyp in the sigmoid colon, removed with a                         cold biopsy forceps. Resected and retrieved.                        - One 3 mm polyp in the descending colon, removed with                         a cold biopsy forceps. Resected and retrieved. Recommendation:        - Discharge patient to home.                        - Resume previous diet.                        - Continue present medications.                        - Await pathology results.                        - Repeat colonoscopy in 7 years if polyp adenoma and  10 years if hyperplastic Procedure Code(s):     --- Professional ---                        [phone], Colonoscopy, flexible; with biopsy, single or                         multiple Diagnosis Code(s):     --- Professional ---                        Z12.11, Encounter for screening for malignant neoplasm                         of colon                        K63.5, Polyp of colon CPT copyright 2019 American Medical Association. All rights reserved. The codes documented in this report are preliminary and upon coder review may  be revised to meet current compliance  requirements. Lucilla Lame MD, MD 04/10/2020 9:09:55 AM This report has been signed electronically. Number of Addenda: 0 Note Initiated On: 04/10/2020 8:43 AM Scope Withdrawal Time: 0 hours 7 minutes 19 seconds  Total Procedure Duration: 0 hours 13 minutes 25 seconds  Estimated Blood Loss:  Estimated blood loss: none.      [facility]

## 2020-04-10 NOTE — Transfer of Care (Signed)
Immediate Anesthesia Transfer of Care Note  Patient: Lindsey Coleman  Procedure(s) Performed: COLONOSCOPY WITH BIOPSY (N/A Rectum) POLYPECTOMY (N/A Rectum)  Patient Location: PACU  Anesthesia Type: General  Level of Consciousness: awake, alert  and patient cooperative  Airway and Oxygen Therapy: Patient Spontanous Breathing and Patient connected to supplemental oxygen  Post-op Assessment: Post-op Vital signs reviewed, Patient's Cardiovascular Status Stable, Respiratory Function Stable, Patent Airway and No signs of Nausea or vomiting  Post-op Vital Signs: Reviewed and stable  Complications: No complications documented.

## 2020-04-10 NOTE — Anesthesia Postprocedure Evaluation (Signed)
Anesthesia Post Note  Patient: HENRETTER PIEKARSKI  Procedure(s) Performed: COLONOSCOPY WITH BIOPSY (N/A Rectum) POLYPECTOMY (N/A Rectum)     Patient location during evaluation: PACU Anesthesia Type: General Level of consciousness: awake and alert and oriented Pain management: satisfactory to patient Vital Signs Assessment: post-procedure vital signs reviewed and stable Respiratory status: spontaneous breathing, nonlabored ventilation and respiratory function stable Cardiovascular status: blood pressure returned to baseline and stable Postop Assessment: Adequate PO intake and No signs of nausea or vomiting Anesthetic complications: no   No complications documented.  Raliegh Ip

## 2020-04-10 NOTE — Anesthesia Preprocedure Evaluation (Signed)
Anesthesia Evaluation  Patient identified by MRN, date of birth, ID band Patient awake    Reviewed: Allergy & Precautions, H&P , NPO status , Patient's Chart, lab work & pertinent test results  History of Anesthesia Complications (+) PONV and history of anesthetic complications  Airway Mallampati: I  TM Distance: >3 FB Neck ROM: full    Dental no notable dental hx.    Pulmonary asthma ,    Pulmonary exam normal breath sounds clear to auscultation       Cardiovascular hypertension, Normal cardiovascular exam Rhythm:regular Rate:Normal     Neuro/Psych    GI/Hepatic   Endo/Other    Renal/GU      Musculoskeletal   Abdominal   Peds  Hematology   Anesthesia Other Findings   Reproductive/Obstetrics                             Anesthesia Physical Anesthesia Plan  ASA: II  Anesthesia Plan: General   Post-op Pain Management:    Induction: Intravenous  PONV Risk Score and Plan: 4 or greater and Treatment may vary due to age or medical condition and Propofol infusion  Airway Management Planned: Natural Airway  Additional Equipment:   Intra-op Plan:   Post-operative Plan:   Informed Consent: I have reviewed the patients History and Physical, chart, labs and discussed the procedure including the risks, benefits and alternatives for the proposed anesthesia with the patient or authorized representative who has indicated his/her understanding and acceptance.     Dental Advisory Given  Plan Discussed with: CRNA  Anesthesia Plan Comments:         Anesthesia Quick Evaluation

## 2020-04-10 NOTE — H&P (Signed)
Lucilla Lame, MD [address] [place], [place] [address] Phone: [phone] Fax : [phone]  Primary Care Physician:  Jerrol Banana., MD Primary Gastroenterologist:  Dr. Allen Norris  Pre-Procedure History & Physical: HPI:  Lindsey Coleman is a 49 y.o. female is here for a screening colonoscopy.   Past Medical History:  Diagnosis Date  . Asthma   . Family history of adverse reaction to anesthesia    Mother - PONV  . Hypertension   . MVP (mitral valve prolapse)   . PONV (postoperative nausea and vomiting)   . Stroke [facility]) 2001   no deficits  . Wears contact lenses     Past Surgical History:  Procedure Laterality Date  . ABDOMINAL HYSTERECTOMY    . KNEE ARTHROSCOPY Bilateral   . NASAL SINUS SURGERY    . OOPHORECTOMY    . TONSILLECTOMY AND ADENOIDECTOMY    . TUBAL LIGATION      Prior to Admission medications   Medication Sig Start Date End Date Taking? Authorizing Provider  aspirin 81 MG tablet Take by mouth.   Yes [provider]  azelastine (OPTIVAR) 0.05 % ophthalmic solution azelastine 0.05 % eye drops   Yes [provider]  Cetirizine HCl (ZYRTEC ALLERGY) 10 MG CAPS Take by mouth.   Yes [provider]  cyclobenzaprine (FLEXERIL) 5 MG tablet Take 1 tablet (5 mg total) by mouth at bedtime. 06/01/19  Yes Jerrol Banana., MD  ECHINACEA PO Take by mouth.   Yes [provider]  levalbuterol Penne Lash HFA) 45 MCG/ACT inhaler Inhale into the lungs.   Yes [provider]  LINZESS 72 MCG capsule TAKE ONE CAPSULE BY MOUTH EVERY MORNING BEFORE MEALS 07/06/18  Yes Jerrol Banana., MD  metoprolol succinate (TOPROL-XL) 25 MG 24 hr tablet Take 1 tablet (25 mg total) by mouth daily. Patient taking differently: Take 12.5 mg by mouth daily.  10/08/19  Yes Jerrol Banana., MD  mometasone (NASONEX) 50 MCG/ACT nasal spray Place 2 sprays into the nose daily. 04/01/16  Yes Jerrol Banana., MD  montelukast  (SINGULAIR) 10 MG tablet Take by mouth.   Yes [provider]  Saccharomyces boulardii (PROBIOTIC) 250 MG CAPS Take 1 capsule by mouth daily. 12/16/16  Yes Jerrol Banana., MD  clotrimazole (LOTRIMIN) 1 % cream Apply 1 application topically 2 (two) times daily. Patient not taking: Reported on 04/10/2020 06/06/17   Mar Daring, PA-C  hyoscyamine (LEVSIN, ANASPAZ) 0.125 MG tablet TAKE ONE TABLET BY MOUTH EVERY 4 HOURS AS NEEDED Patient not taking: Reported on 04/10/2020 01/30/18   Jerrol Banana., MD    Allergies as of 03/08/2020 - Review Complete 03/08/2020  Allergen Reaction Noted  . Codeine Hives and Nausea And Vomiting 12/16/2015  . Hydrocodone-acetaminophen  12/16/2015  . Meperidine Hives and Nausea And Vomiting 12/16/2015  . Morphine Hives and Nausea And Vomiting 12/16/2015  . Sulfa antibiotics Itching and Rash 04/01/2016    Family History  Problem Relation Age of Onset  . Hypertension Mother   . Parkinson's disease Mother   . Hypertension Father   . Cancer Father        lung  . Cancer Paternal Grandmother        breast  . Breast cancer Paternal Grandmother   . Irritable bowel syndrome Daughter   . Crohn's disease Son   . Von Willebrand disease Paternal Aunt   . Heart disease Maternal Grandmother  Social History   Socioeconomic History  . Marital status: Married    Spouse name: Not on file  . Number of children: Not on file  . Years of education: Not on file  . Highest education level: Not on file  Occupational History  . Not on file  Tobacco Use  . Smoking status: Never Smoker  . Smokeless tobacco: Never Used  Vaping Use  . Vaping Use: Never used  Substance and Sexual Activity  . Alcohol use: Yes    Alcohol/week: 3.0 standard drinks    Types: 3 Glasses of wine per week    Comment:    . Drug use: No  . Sexual activity: Not on file  Other Topics Concern  . Not on file  Social History Narrative  . Not on file   Social  Determinants of Health   Financial Resource Strain:   . Difficulty of Paying Living Expenses:   Food Insecurity:   . Worried About Charity fundraiser in the Last Year:   . Arboriculturist in the Last Year:   Transportation Needs:   . Film/video editor (Medical):   Marland Kitchen Lack of Transportation (Non-Medical):   Physical Activity:   . Days of Exercise per Week:   . Minutes of Exercise per Session:   Stress:   . Feeling of Stress :   Social Connections:   . Frequency of Communication with Friends and Family:   . Frequency of Social Gatherings with Friends and Family:   . Attends Religious Services:   . Active Member of Clubs or Organizations:   . Attends Archivist Meetings:   Marland Kitchen Marital Status:   Intimate Partner Violence:   . Fear of Current or Ex-Partner:   . Emotionally Abused:   Marland Kitchen Physically Abused:   . Sexually Abused:     Review of Systems: See HPI, otherwise negative ROS  Physical Exam: BP (!) 150/92   Pulse (!) 57   Temp (!) 97 F (36.1 C) (Temporal)   Resp 16   Ht 5\' 7"  (1.702 m)   Wt 64.9 kg   SpO2 100%   BMI 22.40 kg/m  General:   Alert,  pleasant and cooperative in NAD Head:  Normocephalic and atraumatic. Neck:  Supple; no masses or thyromegaly. Lungs:  Clear throughout to auscultation.    Heart:  Regular rate and rhythm. Abdomen:  Soft, nontender and nondistended. Normal bowel sounds, without guarding, and without rebound.   Neurologic:  Alert and  oriented x4;  grossly normal neurologically.  Impression/Plan: Lindsey Coleman is now here to undergo a screening colonoscopy.  Risks, benefits, and alternatives regarding colonoscopy have been reviewed with the patient.  Questions have been answered.  All parties agreeable.

## 2020-04-10 NOTE — Anesthesia Procedure Notes (Signed)
Date/Time: 04/10/2020 8:50 AM Performed by: Cameron Ali, CRNA Pre-anesthesia Checklist: Patient identified, Emergency Drugs available, Suction available, Timeout performed and Patient being monitored Patient Re-evaluated:Patient Re-evaluated prior to induction Oxygen Delivery Method: Nasal cannula Placement Confirmation: positive ETCO2

## 2020-04-11 ENCOUNTER — Encounter: Payer: Self-pay | Admitting: Gastroenterology

## 2020-04-11 LAB — SURGICAL PATHOLOGY

## 2020-04-12 ENCOUNTER — Encounter: Payer: Self-pay | Admitting: Gastroenterology

## 2020-04-13 LAB — PATHOLOGY

## 2020-05-18 ENCOUNTER — Other Ambulatory Visit: Payer: Self-pay | Admitting: Family Medicine

## 2020-05-18 DIAGNOSIS — I10 Essential (primary) hypertension: Secondary | ICD-10-CM

## 2020-08-23 NOTE — Progress Notes (Deleted)
     Established patient visit   Patient: Lindsey Coleman   DOB: Aug 11, 1971   49 y.o. Female  MRN: [id number] Visit Date: 08/28/2020  Today's healthcare provider: Wilhemena Durie, MD   No chief complaint on file.  Subjective    HPI  Hypertension, follow-up  BP Readings from Last 3 Encounters:  04/10/20 119/85  02/23/20 (!) 132/91  12/06/19 132/78   Wt Readings from Last 3 Encounters:  04/10/20 143 lb (64.9 kg)  02/23/20 143 lb (64.9 kg)  12/06/19 148 lb (67.1 kg)     She was last seen for hypertension 6 months ago.  BP at that visit was 132/91. Management since that visit includes; on metoprolol. She reports {excellent/good/fair/poor:19665} compliance with treatment. She {is/is not:9024} having side effects. {document side effects if present:1} She {is/is not:9024} exercising. She {is/is not:9024} adherent to low salt diet.   Outside blood pressures are {enter patient reported home BP, or 'not being checked':1}.  She {does/does not:200015} smoke.  Use of agents associated with hypertension: {bp agents assoc with hypertension:511::"none"}.   ---------------------------------------------------------------------------------------------------  {Show patient history (optional):23778::" "}   Medications: Outpatient Medications Prior to Visit  Medication Sig  . aspirin 81 MG tablet Take by mouth.  Marland Kitchen azelastine (OPTIVAR) 0.05 % ophthalmic solution azelastine 0.05 % eye drops  . Cetirizine HCl (ZYRTEC ALLERGY) 10 MG CAPS Take by mouth.  . clotrimazole (LOTRIMIN) 1 % cream Apply 1 application topically 2 (two) times daily. (Patient not taking: Reported on 04/10/2020)  . cyclobenzaprine (FLEXERIL) 5 MG tablet Take 1 tablet (5 mg total) by mouth at bedtime.  Marland Kitchen ECHINACEA PO Take by mouth.  . hyoscyamine (LEVSIN, ANASPAZ) 0.125 MG tablet TAKE ONE TABLET BY MOUTH EVERY 4 HOURS AS NEEDED (Patient not taking: Reported on 04/10/2020)  . levalbuterol (XOPENEX HFA) 45 MCG/ACT  inhaler Inhale into the lungs.  Marland Kitchen LINZESS 72 MCG capsule TAKE ONE CAPSULE BY MOUTH EVERY MORNING BEFORE MEALS  . metoprolol succinate (TOPROL-XL) 25 MG 24 hr tablet TAKE 1/2 TABLET BY MOUTH DAILY ***OFFICE VISIT NEEDED BEFORE NEXT REFILL***  . mometasone (NASONEX) 50 MCG/ACT nasal spray Place 2 sprays into the nose daily.  . montelukast (SINGULAIR) 10 MG tablet Take by mouth.  . Saccharomyces boulardii (PROBIOTIC) 250 MG CAPS Take 1 capsule by mouth daily.   No facility-administered medications prior to visit.    Review of Systems  Constitutional: Negative for appetite change, chills, fatigue and fever.  Respiratory: Negative for chest tightness and shortness of breath.   Cardiovascular: Negative for chest pain and palpitations.  Gastrointestinal: Negative for abdominal pain, nausea and vomiting.  Neurological: Negative for dizziness and weakness.    {Heme  Chem  Endocrine  Serology  Results Review (optional):23779::" "}  Objective    There were no vitals taken for this visit. {Show previous vital signs (optional):23777::" "}  Physical Exam  ***  No results found for any visits on 08/28/20.  Assessment & Plan     ***  No follow-ups on file.      {provider attestation***:1}   Wilhemena Durie, MD  [facility] [phone] (phone) [phone] (fax)  [place]

## 2020-08-28 ENCOUNTER — Telehealth: Payer: Self-pay | Admitting: Family Medicine

## 2020-09-05 ENCOUNTER — Encounter: Payer: Self-pay | Admitting: Family Medicine

## 2020-09-20 ENCOUNTER — Ambulatory Visit: Payer: Self-pay | Admitting: Family Medicine

## 2020-09-20 ENCOUNTER — Other Ambulatory Visit: Payer: Self-pay

## 2020-09-20 ENCOUNTER — Ambulatory Visit (INDEPENDENT_AMBULATORY_CARE_PROVIDER_SITE_OTHER): Payer: BC Managed Care – PPO | Admitting: Family Medicine

## 2020-09-20 DIAGNOSIS — Z23 Encounter for immunization: Secondary | ICD-10-CM

## 2020-10-21 ENCOUNTER — Other Ambulatory Visit: Payer: Self-pay | Admitting: Family Medicine

## 2020-10-21 DIAGNOSIS — I10 Essential (primary) hypertension: Secondary | ICD-10-CM

## 2020-10-22 ENCOUNTER — Encounter: Payer: Self-pay | Admitting: *Deleted

## 2020-10-22 NOTE — Telephone Encounter (Signed)
Courtesy refill 30 days. Left message for pt to call to schedule appt.

## 2020-12-19 ENCOUNTER — Other Ambulatory Visit: Payer: Self-pay | Admitting: Family Medicine

## 2020-12-19 DIAGNOSIS — I10 Essential (primary) hypertension: Secondary | ICD-10-CM

## 2020-12-19 NOTE — Telephone Encounter (Signed)
Requested medications are due for refill today.  yes  Requested medications are on the active medications list.  yes  Last refill. 10/22/2020  Future visit scheduled.   no  Notes to clinic.  Courtesy refill already given.

## 2021-06-06 ENCOUNTER — Other Ambulatory Visit: Payer: Self-pay | Admitting: Otolaryngology

## 2021-06-06 DIAGNOSIS — J329 Chronic sinusitis, unspecified: Secondary | ICD-10-CM

## 2021-06-11 ENCOUNTER — Other Ambulatory Visit: Payer: BC Managed Care – PPO

## 2021-06-15 ENCOUNTER — Ambulatory Visit
Admission: RE | Admit: 2021-06-15 | Discharge: 2021-06-15 | Disposition: A | Discharge status: Home / Self Care | Payer: Self-pay | Source: Ambulatory Visit | Attending: Otolaryngology | Admitting: Otolaryngology

## 2021-06-15 DIAGNOSIS — J329 Chronic sinusitis, unspecified: Secondary | ICD-10-CM

## 2021-06-27 ENCOUNTER — Encounter: Payer: Self-pay | Admitting: Family Medicine

## 2021-06-27 ENCOUNTER — Ambulatory Visit (INDEPENDENT_AMBULATORY_CARE_PROVIDER_SITE_OTHER): Payer: BC Managed Care – PPO | Admitting: Family Medicine

## 2021-06-27 ENCOUNTER — Other Ambulatory Visit: Payer: Self-pay

## 2021-06-27 VITALS — HR 61 | Temp 98.1°F | Resp 16 | Ht 68.0 in | Wt 143.0 lb

## 2021-06-27 DIAGNOSIS — M545 Low back pain, unspecified: Secondary | ICD-10-CM | POA: Diagnosis not present

## 2021-06-27 DIAGNOSIS — I10 Essential (primary) hypertension: Secondary | ICD-10-CM | POA: Diagnosis not present

## 2021-06-27 DIAGNOSIS — Z23 Encounter for immunization: Secondary | ICD-10-CM | POA: Diagnosis not present

## 2021-06-27 MED ORDER — CYCLOBENZAPRINE HCL 5 MG PO TABS: 5.0000 mg | ORAL_TABLET | Freq: Every day | ORAL | 3 refills | Status: DC

## 2021-06-27 MED ORDER — METOPROLOL SUCCINATE ER 25 MG PO TB24: 12.5000 mg | ORAL_TABLET | Freq: Every day | ORAL | 2 refills | Status: DC

## 2021-06-27 NOTE — Progress Notes (Signed)
Established patient visit   Patient: Lindsey Coleman   DOB: [birth year]   50 y.o. Female  MRN: [id number] Visit Date: 06/27/2021  Today's healthcare provider: Wilhemena Durie, MD   Chief Complaint  Patient presents with   Hypertension   Subjective    HPI  She comes in today for follow-up.  She needs refills on her Toprol and Flexeril which she takes as needed for muscle strains.  She no longer takes Linzess.  She states her bowel issues are okay Hypertension, follow-up  BP Readings from Last 3 Encounters:  04/10/20 119/85  02/23/20 (!) 132/91  12/06/19 132/78   Wt Readings from Last 3 Encounters:  06/27/21 143 lb (64.9 kg)  04/10/20 143 lb (64.9 kg)  02/23/20 143 lb (64.9 kg)     She was last seen for hypertension 02/23/2020.  BP at that visit was 132/91. Management since that visit includes; on metoprolol. She reports good compliance with treatment. She is not having side effects.  She is not exercising. She is adherent to low salt diet.   Outside blood pressures are not being checked.  She does not smoke.  Use of agents associated with hypertension: none.      Medications: Outpatient Medications Prior to Visit  Medication Sig   ECHINACEA PO Take by mouth.   levalbuterol (XOPENEX HFA) 45 MCG/ACT inhaler Inhale into the lungs.   LINZESS 72 MCG capsule TAKE ONE CAPSULE BY MOUTH EVERY MORNING BEFORE MEALS   metoprolol succinate (TOPROL-XL) 25 MG 24 hr tablet TAKE HALF-TABLET BY MOUTH DAILY**OFFICE VISIT NEEDED BEFORE NEXT REFILL   mometasone (NASONEX) 50 MCG/ACT nasal spray Place 2 sprays into the nose daily.   montelukast (SINGULAIR) 10 MG tablet Take by mouth.   aspirin 81 MG tablet Take by mouth.   azelastine (OPTIVAR) 0.05 % ophthalmic solution azelastine 0.05 % eye drops   Cetirizine HCl (ZYRTEC ALLERGY) 10 MG CAPS Take by mouth.   clotrimazole (LOTRIMIN) 1 % cream Apply 1 application topically 2 (two) times daily. (Patient not taking: Reported on  04/10/2020)   cyclobenzaprine (FLEXERIL) 5 MG tablet Take 1 tablet (5 mg total) by mouth at bedtime.   hyoscyamine (LEVSIN, ANASPAZ) 0.125 MG tablet TAKE ONE TABLET BY MOUTH EVERY 4 HOURS AS NEEDED (Patient not taking: Reported on 04/10/2020)   Saccharomyces boulardii (PROBIOTIC) 250 MG CAPS Take 1 capsule by mouth daily.   No facility-administered medications prior to visit.    Review of Systems  Constitutional:  Negative for appetite change, chills, fatigue and fever.  Respiratory:  Negative for chest tightness and shortness of breath.   Cardiovascular:  Negative for chest pain and palpitations.  Gastrointestinal:  Negative for abdominal pain, nausea and vomiting.  Neurological:  Negative for dizziness and weakness.       Objective    Pulse 61   Temp 98.1 F (36.7 C)   Resp 16   Ht 5\' 8"  (1.727 m)   Wt 143 lb (64.9 kg)   BMI 21.74 kg/m  BP Readings from Last 3 Encounters:  04/10/20 119/85  02/23/20 (!) 132/91  12/06/19 132/78   Wt Readings from Last 3 Encounters:  06/27/21 143 lb (64.9 kg)  04/10/20 143 lb (64.9 kg)  02/23/20 143 lb (64.9 kg)      Physical Exam Vitals reviewed.  Constitutional:      Appearance: She is well-developed.  Eyes:     Conjunctiva/sclera: Conjunctivae normal.     Pupils: Pupils are equal, round, and  reactive to light.  Cardiovascular:     Rate and Rhythm: Normal rate and regular rhythm.     Heart sounds: Normal heart sounds.  Pulmonary:     Effort: Pulmonary effort is normal.     Breath sounds: Normal breath sounds.  Abdominal:     Palpations: Abdomen is soft.  Musculoskeletal:     Cervical back: Normal range of motion and neck supple.     Right lower leg: No edema.     Left lower leg: No edema.  Skin:    General: Skin is warm and dry.  Neurological:     General: No focal deficit present.     Mental Status: She is alert and oriented to person, place, and time.     Deep Tendon Reflexes: Reflexes are normal and symmetric.   Psychiatric:        Mood and Affect: Mood normal.        Behavior: Behavior normal.        Thought Content: Thought content normal.        Judgment: Judgment normal.      No results found for any visits on 06/27/21.  Assessment & Plan     1. Essential hypertension Good control. - metoprolol succinate (TOPROL-XL) 25 MG 24 hr tablet; Take 0.5 tablets (12.5 mg total) by mouth daily.  Dispense: 90 tablet; Refill: 2  2. Low back pain, unspecified back pain laterality, unspecified chronicity, unspecified whether sciatica present Takes as needed Flexeril for muscular strain. - cyclobenzaprine (FLEXERIL) 5 MG tablet; Take 1 tablet (5 mg total) by mouth at bedtime.  Dispense: 30 tablet; Refill: 3  3. Need for influenza vaccination  - Flu Vaccine QUAD 6+ mos PF IM (Fluarix Quad PF)   No follow-ups on file.      I, Wilhemena Durie, MD, have reviewed all documentation for this visit. The documentation on 06/29/21 for the exam, diagnosis, procedures, and orders are all accurate and complete.    Gevin Perea Cranford Mon, MD  [facility] [phone] (phone) [phone] (fax)  [place]

## 2021-07-18 ENCOUNTER — Telehealth: Payer: Self-pay | Admitting: Family Medicine

## 2021-07-18 NOTE — Telephone Encounter (Signed)
EKG printed and faxed 07/18/21 ed

## 2021-07-18 NOTE — Telephone Encounter (Signed)
Caller name: Algie Coffer Relation to pt: [place]  Call back number: [phone] ext [phone] fax # [phone]    Reason for call: Patient is having sinus surgery tomorrow and caller states a STAT request was fax for a copy of 1/30 EKG report. Caller will re fax request again. Please fax most recent EKG and note when faxed.

## 2022-03-10 IMAGING — MG DIGITAL SCREENING BILAT W/ TOMO W/ CAD
8 series · 8 of 24 positions shown · non-contrast
Comparison: Previous exam(s).

CLINICAL DATA: Screening.

EXAM:
DIGITAL SCREENING BILATERAL MAMMOGRAM WITH TOMO AND CAD

[L CC synth-2D]
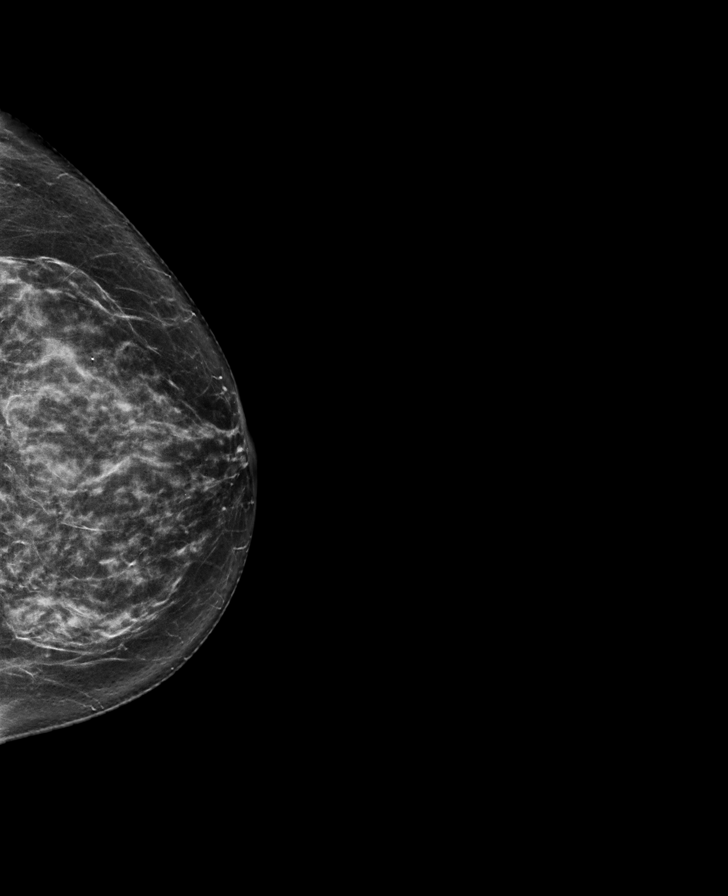

[L MLO synth-2D]
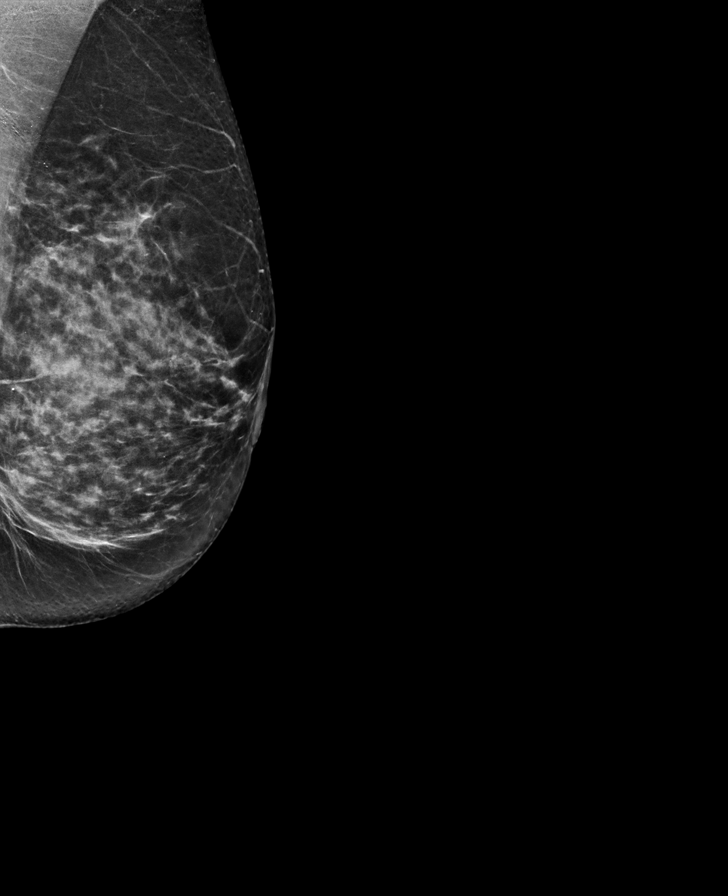

[R CC synth-2D]
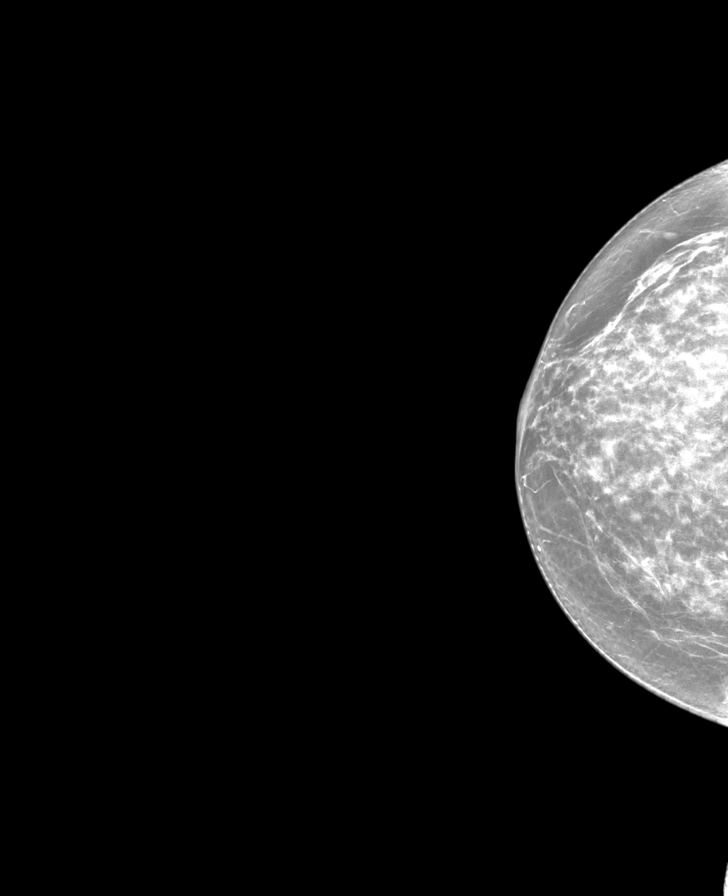

[R MLO synth-2D]
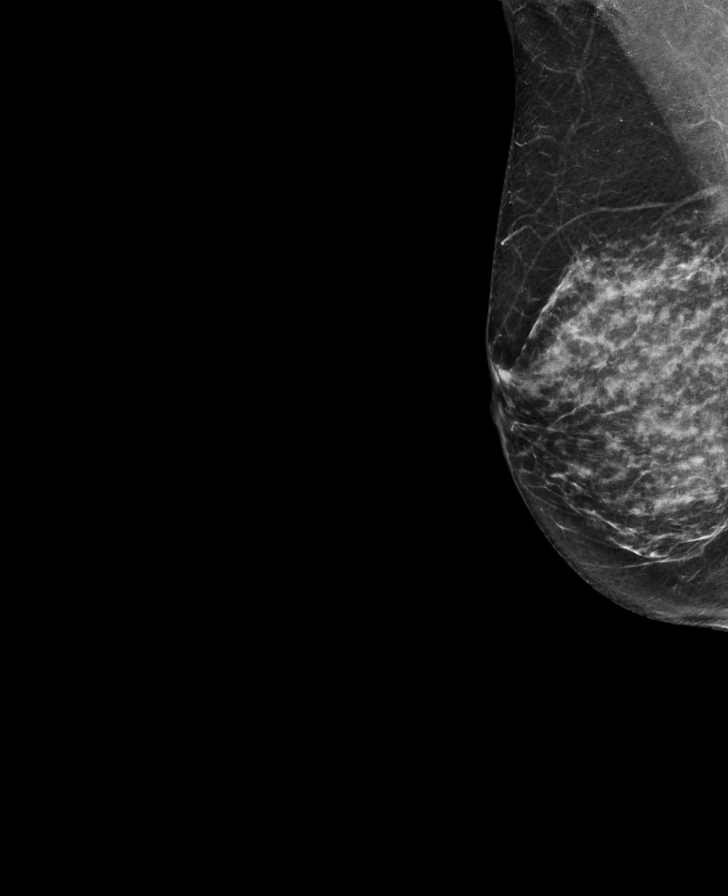

[R MLO tomo · tomo slice 35/70.0]
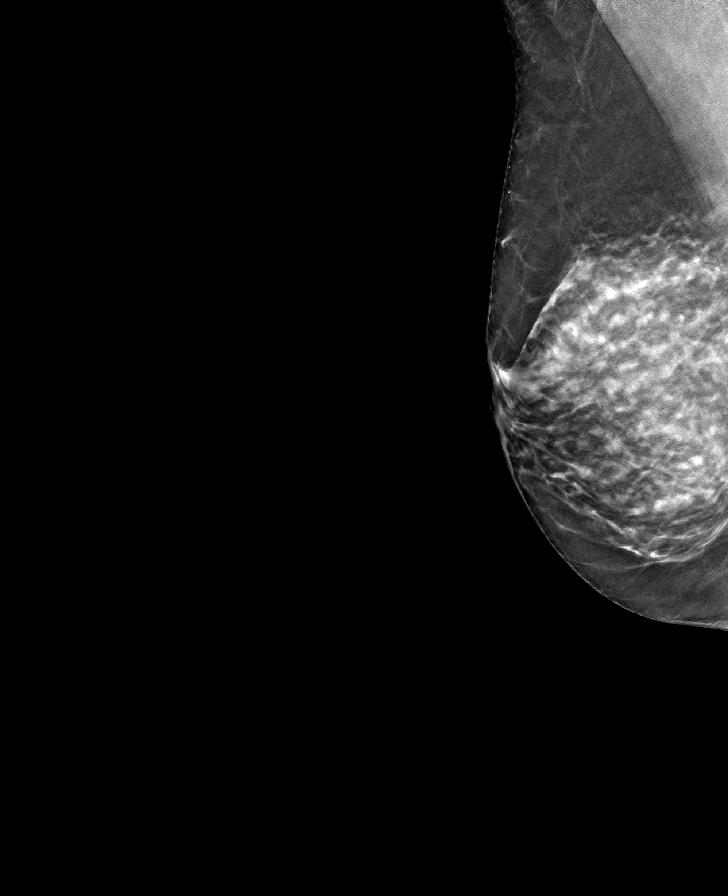

[R CC tomo · tomo slice 35/68.0]
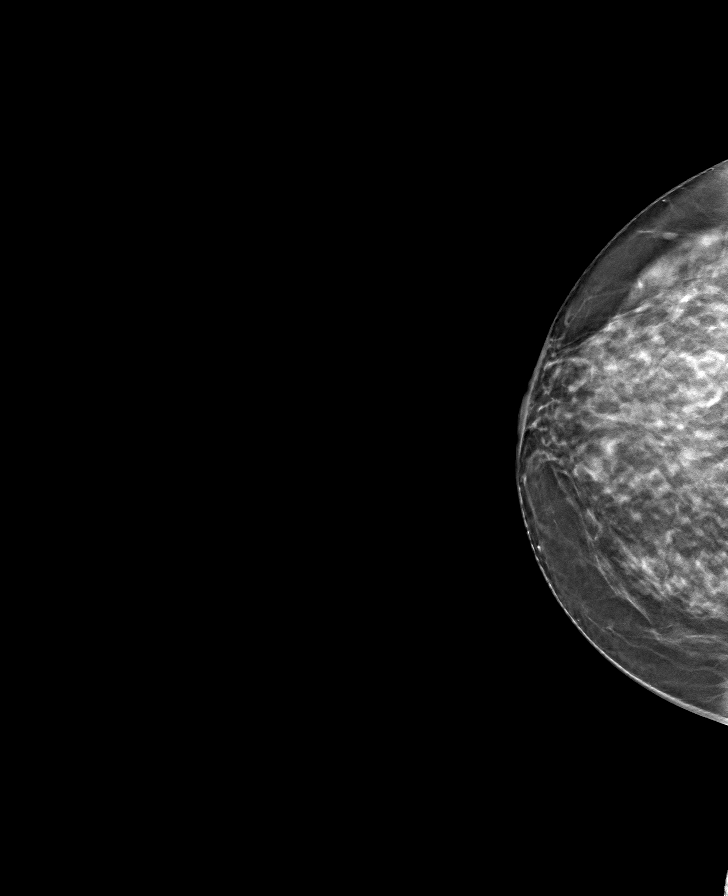

[L MLO tomo · tomo slice 36/71.0]
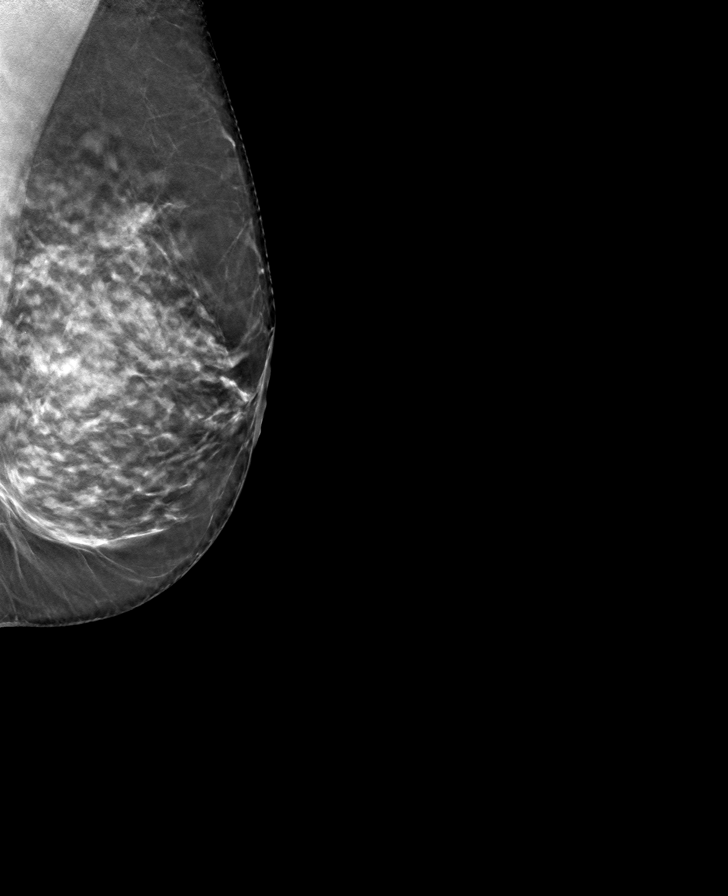

[L CC tomo · tomo slice 37/73.0]
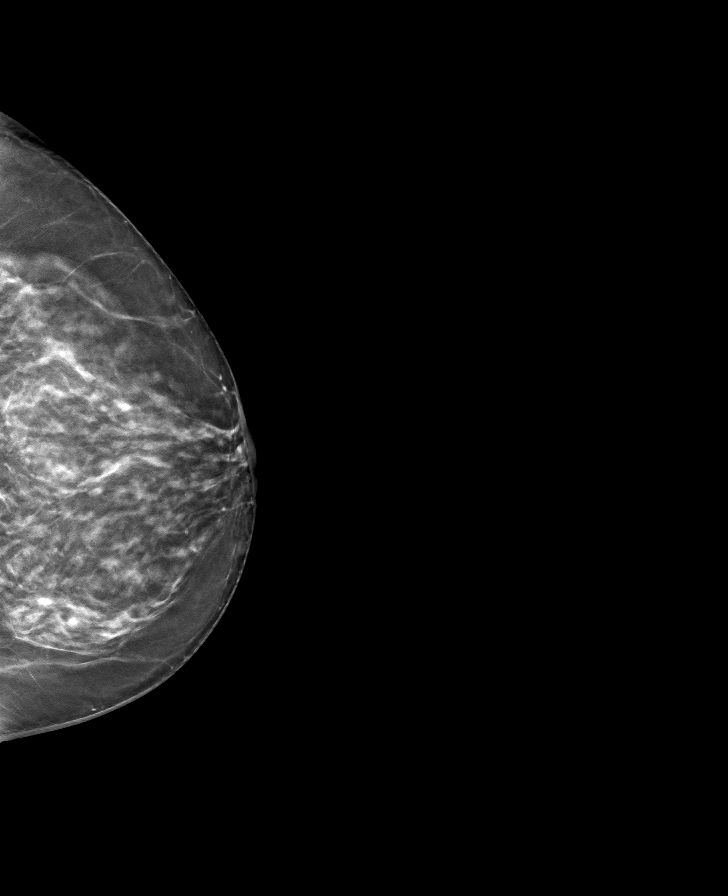

[8 of 24 positions shown; findings below may reference images not displayed]

ACR Breast Density Category c: The breast tissue is heterogeneously
dense, which may obscure small masses.
FINDINGS: There are no findings suspicious for malignancy. Images were
processed with CAD.
IMPRESSION: No mammographic evidence of malignancy. A result letter of this
screening mammogram will be mailed directly to the patient.

RECOMMENDATION:
Screening mammogram in one year. (Code:FT-U-LHB)

BI-RADS CATEGORY  1: Negative.

## 2022-03-18 NOTE — Progress Notes (Deleted)
Complete physical exam   Patient: Lindsey Coleman   DOB: 08/06/1971   51 y.o. Female  MRN: [id number] Visit Date: 03/19/2022  Today's healthcare provider: Wilhemena Durie, MD   No chief complaint on file.  Subjective    Lindsey Coleman is a 51 y.o. female who presents today for a complete physical exam.  She reports consuming a {diet types:17450} diet. {Exercise:19826} She generally feels {well/fairly well/poorly:18703}. She reports sleeping {well/fairly well/poorly:18703}. She {does/does not:200015} have additional problems to discuss today.  HPI  ***  Past Medical History:  Diagnosis Date   Asthma    Family history of adverse reaction to anesthesia    Mother - PONV   Hypertension    MVP (mitral valve prolapse)    PONV (postoperative nausea and vomiting)    Stroke ([place]) 2001   no deficits   Wears contact lenses    Past Surgical History:  Procedure Laterality Date   ABDOMINAL HYSTERECTOMY     COLONOSCOPY WITH PROPOFOL N/A 04/10/2020   Procedure: COLONOSCOPY WITH BIOPSY;  Surgeon: Lucilla Lame, MD;  Location: [place];  Service: Endoscopy;  Laterality: N/A;  priority 4   KNEE ARTHROSCOPY Bilateral    NASAL SINUS SURGERY     OOPHORECTOMY     POLYPECTOMY N/A 04/10/2020   Procedure: POLYPECTOMY;  Surgeon: Lucilla Lame, MD;  Location: [place];  Service: Endoscopy;  Laterality: N/A;   TONSILLECTOMY AND ADENOIDECTOMY     TUBAL LIGATION     Social History   Socioeconomic History   Marital status: Married    Spouse name: Not on file   Number of children: Not on file   Years of education: Not on file   Highest education level: Not on file  Occupational History   Not on file  Tobacco Use   Smoking status: Never   Smokeless tobacco: Never  Vaping Use   Vaping Use: Never used  Substance and Sexual Activity   Alcohol use: Yes    Alcohol/week: 3.0 standard drinks of alcohol    Types: 3 Glasses of wine per week    Comment:     Drug use: No    Sexual activity: Not on file  Other Topics Concern   Not on file  Social History Narrative   Not on file   Social Determinants of Health   Financial Resource Strain: Not on file  Food Insecurity: Not on file  Transportation Needs: Not on file  Physical Activity: Not on file  Stress: Not on file  Social Connections: Not on file  Intimate Partner Violence: Not on file   Family Status  Relation Name Status   Mother  Alive   Father  Deceased at age 73   [facility]  Deceased   Daughter  Alive   Son  [place]  (Not Specified)   Family History  Problem Relation Age of Onset   Hypertension Mother    Parkinson's disease Mother    Hypertension Father    Cancer Father        lung   Cancer Paternal Grandmother        breast   Breast cancer Paternal Grandmother    Irritable bowel syndrome Daughter    Crohn's disease Son    Von Willebrand disease Paternal Aunt    Heart disease Maternal Grandmother    Allergies  Allergen Reactions   Codeine Hives and Nausea And Vomiting  Other reaction(s): Headache, Hives   Hydrocodone-Acetaminophen     Other reaction(s): Headache, Hives   Meperidine Hives and Nausea And Vomiting    Other reaction(s): Headache, Hives   Morphine Hives and Nausea And Vomiting    Other reaction(s): Headache, Hives   Sulfa Antibiotics Itching and Rash    Patient Care Team: Jerrol Banana., MD as PCP - General (Family Medicine)   Medications: Outpatient Medications Prior to Visit  Medication Sig   aspirin 81 MG tablet Take by mouth.   azelastine (OPTIVAR) 0.05 % ophthalmic solution azelastine 0.05 % eye drops   Cetirizine HCl (ZYRTEC ALLERGY) 10 MG CAPS Take by mouth.   clotrimazole (LOTRIMIN) 1 % cream Apply 1 application topically 2 (two) times daily. (Patient not taking: Reported on 04/10/2020)   cyclobenzaprine (FLEXERIL) 5 MG tablet Take 1 tablet (5 mg total) by mouth at bedtime.   ECHINACEA PO Take by mouth.   hyoscyamine  (LEVSIN, ANASPAZ) 0.125 MG tablet TAKE ONE TABLET BY MOUTH EVERY 4 HOURS AS NEEDED (Patient not taking: Reported on 04/10/2020)   levalbuterol (XOPENEX HFA) 45 MCG/ACT inhaler Inhale into the lungs.   LINZESS 72 MCG capsule TAKE ONE CAPSULE BY MOUTH EVERY MORNING BEFORE MEALS   metoprolol succinate (TOPROL-XL) 25 MG 24 hr tablet Take 0.5 tablets (12.5 mg total) by mouth daily.   mometasone (NASONEX) 50 MCG/ACT nasal spray Place 2 sprays into the nose daily.   montelukast (SINGULAIR) 10 MG tablet Take by mouth.   Saccharomyces boulardii (PROBIOTIC) 250 MG CAPS Take 1 capsule by mouth daily.   No facility-administered medications prior to visit.    Review of Systems  All other systems reviewed and are negative.   {Labs  Heme  Chem  Endocrine  Serology  Results Review (optional):23779}  Objective    There were no vitals taken for this visit. {Show previous vital signs (optional):23777}   Physical Exam  ***  Last depression screening scores    09/06/2019    2:31 PM 03/31/2018   10:31 AM 12/16/2016    4:20 PM  PHQ 2/9 Scores  PHQ - 2 Score 2 0 0  PHQ- 9 Score 2  5   Last fall risk screening    03/31/2018   10:31 AM  Fall Risk   Falls in the past year? No   Last Audit-C alcohol use screening     No data to display         A score of 3 or more in women, and 4 or more in men indicates increased risk for alcohol abuse, EXCEPT if all of the points are from question 1   No results found for any visits on 03/19/22.  Assessment & Plan    Routine Health Maintenance and Physical Exam  Exercise Activities and Dietary recommendations  Goals   None     Immunization History  Administered Date(s) Administered   Influenza,inj,Quad PF,6+ Mos 09/06/2019, 09/20/2020, 06/27/2021   PFIZER(Purple Top)SARS-COV-2 Vaccination 11/28/2019, 12/21/2019    Health Maintenance  Topic Date Due   URINE MICROALBUMIN  Never done   HIV Screening  Never done   Hepatitis C Screening  Never  done   TETANUS/TDAP  Never done   PAP SMEAR-Modifier  Never done   Zoster Vaccines- Shingrix (1 of 2) Never done   COVID-19 Vaccine (4 - Pfizer series) 04/20/2020   MAMMOGRAM  03/09/2022   INFLUENZA VACCINE  04/30/2022   COLONOSCOPY (Pts 45-8yr Insurance coverage will need to be confirmed)  04/11/2027  HPV VACCINES  Aged Out    Discussed health benefits of physical activity, and encouraged her to engage in regular exercise appropriate for her age and condition.  ***  No follow-ups on file.     {provider attestation***:1}   Wilhemena Durie, MD  [facility] [phone] (phone) [phone] (fax)  [place]

## 2022-03-19 ENCOUNTER — Encounter: Payer: BC Managed Care – PPO | Admitting: Family Medicine

## 2022-03-19 DIAGNOSIS — J3089 Other allergic rhinitis: Secondary | ICD-10-CM

## 2022-03-19 DIAGNOSIS — K581 Irritable bowel syndrome with constipation: Secondary | ICD-10-CM

## 2022-03-19 DIAGNOSIS — Z0000 Encounter for general adult medical examination without abnormal findings: Secondary | ICD-10-CM

## 2022-03-19 DIAGNOSIS — I10 Essential (primary) hypertension: Secondary | ICD-10-CM

## 2022-04-08 ENCOUNTER — Encounter: Payer: Self-pay | Admitting: Family Medicine

## 2022-04-08 ENCOUNTER — Ambulatory Visit (INDEPENDENT_AMBULATORY_CARE_PROVIDER_SITE_OTHER): Payer: BC Managed Care – PPO | Admitting: Family Medicine

## 2022-04-08 VITALS — BP 131/99 | HR 80 | Resp 16 | Ht 68.0 in | Wt 147.0 lb

## 2022-04-08 DIAGNOSIS — I10 Essential (primary) hypertension: Secondary | ICD-10-CM | POA: Diagnosis not present

## 2022-04-08 DIAGNOSIS — J3089 Other allergic rhinitis: Secondary | ICD-10-CM | POA: Diagnosis not present

## 2022-04-08 DIAGNOSIS — K581 Irritable bowel syndrome with constipation: Secondary | ICD-10-CM

## 2022-04-08 DIAGNOSIS — J3081 Allergic rhinitis due to animal (cat) (dog) hair and dander: Secondary | ICD-10-CM | POA: Insufficient documentation

## 2022-04-08 DIAGNOSIS — Z0000 Encounter for general adult medical examination without abnormal findings: Secondary | ICD-10-CM

## 2022-04-08 DIAGNOSIS — Z23 Encounter for immunization: Secondary | ICD-10-CM | POA: Diagnosis not present

## 2022-04-08 DIAGNOSIS — J301 Allergic rhinitis due to pollen: Secondary | ICD-10-CM | POA: Insufficient documentation

## 2022-04-08 DIAGNOSIS — H1045 Other chronic allergic conjunctivitis: Secondary | ICD-10-CM | POA: Insufficient documentation

## 2022-04-08 DIAGNOSIS — J4520 Mild intermittent asthma, uncomplicated: Secondary | ICD-10-CM

## 2022-04-08 NOTE — Patient Instructions (Addendum)
Try over-the-counter Voltaren Gel for Thumb pain. Call for Mammogram.

## 2022-04-08 NOTE — Progress Notes (Unsigned)
Complete physical exam  I,Lindsey Coleman,acting as a scribe for Lindsey Durie, MD.,have documented all relevant documentation on the behalf of Lindsey Durie, MD,as directed by  Lindsey Durie, MD while in the presence of Lindsey Durie, MD.   Patient: Lindsey Coleman   DOB: 1971-02-12   51 y.o. Female  MRN: [id number] Visit Date: 04/08/2022  Today's healthcare provider: Wilhemena Durie, MD   Chief Complaint  Patient presents with   Annual Exam   Subjective    ULAH Coleman is a 51 y.o. female who presents today for a complete physical exam.  She reports consuming a general diet. Home exercise routine includes walking. She generally feels well. She reports sleeping fairly well. She does not have additional problems to discuss today.  She is married and is a mother of 1.  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  She wishes to work another 5 years for retirement and benefits. HPI    Past Medical History:  Diagnosis Date   Asthma    Family history of adverse reaction to anesthesia    Mother - PONV   Hypertension    MVP (mitral valve prolapse)    PONV (postoperative nausea and vomiting)    Stroke ([place]) 2001   no deficits   Wears contact lenses    Past Surgical History:  Procedure Laterality Date   ABDOMINAL HYSTERECTOMY     COLONOSCOPY WITH PROPOFOL N/A 04/10/2020   Procedure: COLONOSCOPY WITH BIOPSY;  Surgeon: Lindsey Lame, MD;  Location: [place];  Service: Endoscopy;  Laterality: N/A;  priority 4   KNEE ARTHROSCOPY Bilateral    NASAL SINUS SURGERY     OOPHORECTOMY     POLYPECTOMY N/A 04/10/2020   Procedure: POLYPECTOMY;  Surgeon: Lindsey Lame, MD;  Location: [place];  Service: Endoscopy;  Laterality: N/A;   TONSILLECTOMY AND ADENOIDECTOMY     TUBAL LIGATION     Social History   Socioeconomic History   Marital status: Married    Spouse name: Not on file   Number of children: Not on file   Years of  education: Not on file   Highest education level: Not on file  Occupational History   Not on file  Tobacco Use   Smoking status: Never   Smokeless tobacco: Never  Vaping Use   Vaping Use: Never used  Substance and Sexual Activity   Alcohol use: Yes    Alcohol/week: 3.0 standard drinks of alcohol    Types: 3 Glasses of wine per week    Comment:     Drug use: No   Sexual activity: Not on file  Other Topics Concern   Not on file  Social History Narrative   Not on file   Social Determinants of Health   Financial Resource Strain: Not on file  Food Insecurity: Not on file  Transportation Needs: Not on file  Physical Activity: Not on file  Stress: Not on file  Social Connections: Not on file  Intimate Partner Violence: Not on file   Family Status  Relation Name Status   Mother  Alive   Father  Deceased at age 45   [facility]  Deceased   Daughter  Alive   Son  [place]  (Not Specified)   Family History  Problem Relation Age of Onset   Hypertension Mother    Parkinson's disease Mother    Hypertension Father  Cancer Father        lung   Cancer Paternal Grandmother        breast   Breast cancer Paternal Grandmother    Irritable bowel syndrome Daughter    Crohn's disease Son    Von Willebrand disease Paternal Aunt    Heart disease Maternal Grandmother    Allergies  Allergen Reactions   Codeine Hives and Nausea And Vomiting    Other reaction(s): Headache, Hives   Hydrocodone-Acetaminophen     Other reaction(s): Headache, Hives   Meperidine Hives and Nausea And Vomiting    Other reaction(s): Headache, Hives   Morphine Hives and Nausea And Vomiting    Other reaction(s): Headache, Hives   Sulfa Antibiotics Itching and Rash    Patient Care Team: Lindsey Coleman., MD as PCP - General (Family Medicine)   Medications: Outpatient Medications Prior to Visit  Medication Sig   aspirin 81 MG tablet Take by mouth.   azelastine (OPTIVAR) 0.05 %  ophthalmic solution azelastine 0.05 % eye drops   Cetirizine HCl (ZYRTEC ALLERGY) 10 MG CAPS Take by mouth.   clotrimazole (LOTRIMIN) 1 % cream Apply 1 application topically 2 (two) times daily.   cyclobenzaprine (FLEXERIL) 5 MG tablet Take 1 tablet (5 mg total) by mouth at bedtime.   ECHINACEA PO Take by mouth.   metoprolol succinate (TOPROL-XL) 25 MG 24 hr tablet Take 0.5 tablets (12.5 mg total) by mouth daily.   mometasone (NASONEX) 50 MCG/ACT nasal spray Place 2 sprays into the nose daily.   montelukast (SINGULAIR) 10 MG tablet Take by mouth.   Saccharomyces boulardii (PROBIOTIC) 250 MG CAPS Take 1 capsule by mouth daily.   [DISCONTINUED] hyoscyamine (LEVSIN, ANASPAZ) 0.125 MG tablet TAKE ONE TABLET BY MOUTH EVERY 4 HOURS AS NEEDED (Patient not taking: No sig reported)   [DISCONTINUED] levalbuterol (XOPENEX HFA) 45 MCG/ACT inhaler Inhale into the lungs. (Patient not taking: Reported on 04/08/2022)   [DISCONTINUED] LINZESS 72 MCG capsule TAKE ONE CAPSULE BY MOUTH EVERY MORNING BEFORE MEALS (Patient not taking: Reported on 04/08/2022)   No facility-administered medications prior to visit.    Review of Systems  Respiratory:  Positive for cough, chest tightness and wheezing.   Musculoskeletal:  Positive for arthralgias and joint swelling.  Allergic/Immunologic: Positive for environmental allergies.  All other systems reviewed and are negative.     Objective     BP (!) 131/99 (BP Location: Left Arm, Patient Position: Sitting, Cuff Size: Normal)   Pulse 80   Resp 16   Ht '5\' 8"'$  (1.727 m)   Wt 147 lb (66.7 kg)   SpO2 99%   BMI 22.35 kg/m     Physical Exam Constitutional:      Appearance: Normal appearance.  HENT:     Head: Normocephalic and atraumatic.     Right Ear: Tympanic membrane, ear canal and external ear normal.     Left Ear: Tympanic membrane, ear canal and external ear normal.     Nose: Nose normal.     Mouth/Throat:     Mouth: Mucous membranes are moist.      Pharynx: Oropharynx is clear.  Eyes:     Extraocular Movements: Extraocular movements intact.     Conjunctiva/sclera: Conjunctivae normal.     Pupils: Pupils are equal, round, and reactive to light.  Cardiovascular:     Rate and Rhythm: Normal rate and regular rhythm.     Pulses: Normal pulses.     Heart sounds: Normal heart sounds.  Pulmonary:  Effort: Pulmonary effort is normal.     Breath sounds: Normal breath sounds.  Abdominal:     General: Bowel sounds are normal.     Palpations: Abdomen is soft.  Musculoskeletal:     Cervical back: Normal range of motion and neck supple.  Skin:    General: Skin is warm and dry.  Neurological:     General: No focal deficit present.     Mental Status: She is alert and oriented to person, place, and time. Mental status is at baseline.  Psychiatric:        Mood and Affect: Mood normal.        Behavior: Behavior normal.        Thought Content: Thought content normal.        Judgment: Judgment normal.       Last depression screening scores    04/08/2022    3:48 PM 09/06/2019    2:31 PM 03/31/2018   10:31 AM  PHQ 2/9 Scores  PHQ - 2 Score 0 2 0  PHQ- 9 Score 0 2    Last fall risk screening    04/08/2022    3:48 PM  Fall Risk   Falls in the past year? 0  Number falls in past yr: 0  Injury with Fall? 0  Risk for fall due to : No Fall Risks  Follow up Falls evaluation completed   Last Audit-C alcohol use screening    04/08/2022    3:46 PM  Alcohol Use Disorder Test (AUDIT)  1. How often do you have a drink containing alcohol? 3  2. How many drinks containing alcohol do you have on a typical day when you are drinking? 0  3. How often do you have six or more drinks on one occasion? 0  AUDIT-C Score 3  4. How often during the last year have you found that you were not able to stop drinking once you had started? 0  5. How often during the last year have you failed to do what was normally expected from you because of drinking? 0   6. How often during the last year have you needed a first drink in the morning to get yourself going after a heavy drinking session? 0  7. How often during the last year have you had a feeling of guilt of remorse after drinking? 0  8. How often during the last year have you been unable to remember what happened the night before because you had been drinking? 0  9. Have you or someone else been injured as a result of your drinking? 0  10. Has a relative or friend or a doctor or another health worker been concerned about your drinking or suggested you cut down? 0  Alcohol Use Disorder Identification Test Final Score (AUDIT) 3   A score of 3 or more in women, and 4 or more in men indicates increased risk for alcohol abuse, EXCEPT if all of the points are from question 1   No results found for any visits on 04/08/22.  Assessment & Plan    Routine Health Maintenance and Physical Exam  Exercise Activities and Dietary recommendations  Goals   None     Immunization History  Administered Date(s) Administered   Influenza,inj,Quad PF,6+ Mos 09/06/2019, 09/20/2020, 06/27/2021   PFIZER Comirnaty(Gray Top)Covid-19 Tri-Sucrose Vaccine 10/14/2020   PFIZER(Purple Top)SARS-COV-2 Vaccination 11/28/2019, 12/21/2019   Pfizer Covid-19 Vaccine Bivalent Booster 92yr & up 07/27/2021   Tdap 04/08/2022  Health Maintenance  Topic Date Due   HIV Screening  Never done   Diabetic kidney evaluation - Urine ACR  Never done   Hepatitis C Screening  Never done   PAP SMEAR-Modifier  Never done   Zoster Vaccines- Shingrix (1 of 2) Never done   Diabetic kidney evaluation - GFR measurement  02/22/2021   MAMMOGRAM  03/09/2022   INFLUENZA VACCINE  04/30/2022   COLONOSCOPY (Pts 45-79yr Insurance coverage will need to be confirmed)  04/11/2027   TETANUS/TDAP  04/08/2032   COVID-19 Vaccine  Completed   HPV VACCINES  Aged Out    Discussed health benefits of physical activity, and encouraged her to engage in  regular exercise appropriate for her age and condition.  1. Annual physical exam Pelvic exam next year. Update Tdap - Lipid panel - TSH - CBC w/Diff/Platelet - Comprehensive Metabolic Panel (CMET)  2. Essential hypertension Follow-up in a few months for blood pressure readings at home - Lipid panel - TSH - CBC w/Diff/Platelet - Comprehensive Metabolic Panel (CMET)  3. Allergic rhinitis due to other allergic trigger, unspecified seasonality  - Lipid panel - TSH - CBC w/Diff/Platelet - Comprehensive Metabolic Panel (CMET)  4. Mild intermittent extrinsic asthma without complication  - Lipid panel - TSH - CBC w/Diff/Platelet - Comprehensive Metabolic Panel (CMET)  5. Irritable bowel syndrome with constipation  - Lipid panel - TSH - CBC w/Diff/Platelet - Comprehensive Metabolic Panel (CMET)  6. Need for Tdap vaccination  - Tdap vaccine greater than or equal to 7yo IM 7.  Hand pain Declines x-ray.  Could be thumb arthritis or tendinitis.  Try over-the-counter Voltaren gel   Return in about 1 year (around 04/09/2023).     I, RWilhemena Durie MD, have reviewed all documentation for this visit. The documentation on 04/10/22 for the exam, diagnosis, procedures, and orders are all accurate and complete.    Lindsey Coleman GCranford Mon MD  [phone](phone) [phone](fax)  [place]

## 2022-04-19 LAB — CBC WITH DIFFERENTIAL/PLATELET
Basophils Absolute: 0 10*3/uL (ref 0.0–0.2)
Basos: 1 %
EOS (ABSOLUTE): 0.5 10*3/uL — ABNORMAL HIGH (ref 0.0–0.4)
Eos: 7 %
Hematocrit: 43.4 % (ref 34.0–46.6)
Hemoglobin: 14.2 g/dL (ref 11.1–15.9)
Immature Grans (Abs): 0 10*3/uL (ref 0.0–0.1)
Immature Granulocytes: 0 %
Lymphocytes Absolute: 1.9 10*3/uL (ref 0.7–3.1)
Lymphs: 31 %
MCH: 28.8 pg (ref 26.6–33.0)
MCHC: 32.7 g/dL (ref 31.5–35.7)
MCV: 88 fL (ref 79–97)
Monocytes Absolute: 0.4 10*3/uL (ref 0.1–0.9)
Monocytes: 7 %
Neutrophils Absolute: 3.3 10*3/uL (ref 1.4–7.0)
Neutrophils: 54 %
Platelets: 303 10*3/uL (ref 150–450)
RBC: 4.93 x10E6/uL (ref 3.77–5.28)
RDW: 11.9 % (ref 11.7–15.4)
WBC: 6.1 10*3/uL (ref 3.4–10.8)

## 2022-04-19 LAB — LIPID PANEL
Chol/HDL Ratio: 3.3 ratio (ref 0.0–4.4)
Cholesterol, Total: 202 mg/dL — ABNORMAL HIGH (ref 100–199)
HDL: 62 mg/dL (ref >39)
LDL Chol Calc (NIH): 115 mg/dL — ABNORMAL HIGH (ref 0–99)
Triglycerides: 140 mg/dL (ref 0–149)
VLDL Cholesterol Cal: 25 mg/dL (ref 5–40)

## 2022-04-19 LAB — COMPREHENSIVE METABOLIC PANEL
ALT: 15 IU/L (ref 0–32)
AST: 18 IU/L (ref 0–40)
Albumin/Globulin Ratio: 2 (ref 1.2–2.2)
Albumin: 4.8 g/dL (ref 3.8–4.9)
Alkaline Phosphatase: 83 IU/L (ref 44–121)
BUN/Creatinine Ratio: 24 — ABNORMAL HIGH (ref 9–23)
BUN: 24 mg/dL (ref 6–24)
Bilirubin Total: 0.4 mg/dL (ref 0.0–1.2)
CO2: 26 mmol/L (ref 20–29)
Calcium: 10.2 mg/dL (ref 8.7–10.2)
Chloride: 100 mmol/L (ref 96–106)
Creatinine, Ser: 1 mg/dL (ref 0.57–1.00)
Globulin, Total: 2.4 g/dL (ref 1.5–4.5)
Glucose: 92 mg/dL (ref 70–99)
Potassium: 4.7 mmol/L (ref 3.5–5.2)
Sodium: 142 mmol/L (ref 134–144)
Total Protein: 7.2 g/dL (ref 6.0–8.5)
eGFR: 68 mL/min/{1.73_m2} (ref >59)

## 2022-04-19 LAB — TSH: TSH: 2.98 u[IU]/mL (ref 0.450–4.500)

## 2022-04-23 ENCOUNTER — Encounter: Payer: Self-pay | Admitting: Family Medicine

## 2022-04-23 ENCOUNTER — Other Ambulatory Visit: Payer: Self-pay | Admitting: Family Medicine

## 2022-04-23 DIAGNOSIS — Z1231 Encounter for screening mammogram for malignant neoplasm of breast: Secondary | ICD-10-CM

## 2022-05-16 ENCOUNTER — Ambulatory Visit
Admission: RE | Admit: 2022-05-16 | Discharge: 2022-05-16 | Disposition: A | Discharge status: Home / Self Care | Payer: BC Managed Care – PPO | Source: Ambulatory Visit | Attending: Family Medicine | Admitting: Family Medicine

## 2022-05-16 DIAGNOSIS — Z1231 Encounter for screening mammogram for malignant neoplasm of breast: Secondary | ICD-10-CM | POA: Insufficient documentation

## 2023-01-20 ENCOUNTER — Telehealth: Payer: Self-pay | Admitting: Family Medicine

## 2023-01-20 DIAGNOSIS — I10 Essential (primary) hypertension: Secondary | ICD-10-CM

## 2023-01-20 MED ORDER — METOPROLOL SUCCINATE ER 25 MG PO TB24: 12.5000 mg | ORAL_TABLET | Freq: Every day | ORAL | 0 refills | Status: DC

## 2023-01-20 NOTE — Telephone Encounter (Signed)
[facility] faxed refill request for the following medications:    metoprolol succinate (TOPROL-XL) 25 MG 24 hr tablet   Please advise

## 2023-04-10 ENCOUNTER — Encounter: Payer: BC Managed Care – PPO | Admitting: Family Medicine

## 2023-04-16 ENCOUNTER — Other Ambulatory Visit: Payer: Self-pay | Admitting: Family Medicine

## 2023-04-16 DIAGNOSIS — I10 Essential (primary) hypertension: Secondary | ICD-10-CM

## 2023-08-03 ENCOUNTER — Other Ambulatory Visit: Payer: Self-pay | Admitting: Family Medicine

## 2023-08-03 DIAGNOSIS — I10 Essential (primary) hypertension: Secondary | ICD-10-CM

## 2023-11-05 ENCOUNTER — Ambulatory Visit: Payer: Self-pay | Admitting: Urology

## 2023-11-07 ENCOUNTER — Other Ambulatory Visit: Payer: Self-pay | Admitting: Family Medicine

## 2023-11-07 DIAGNOSIS — I10 Essential (primary) hypertension: Secondary | ICD-10-CM

## 2023-11-07 NOTE — Telephone Encounter (Signed)
 Requested Prescriptions  Refused Prescriptions Disp Refills   metoprolol  succinate (TOPROL -XL) 25 MG 24 hr tablet [Pharmacy Med Name: METOPROLOL  SUCC ER 25 MG TAB] 45 tablet 0    Sig: TAKE A HALF TABLET BY MOUTH DAILY     Cardiovascular:  Beta Blockers Failed - 11/07/2023  3:24 PM      Failed - Last BP in normal range    BP Readings from Last 1 Encounters:  04/08/22 (!) 131/99         Failed - Valid encounter within last 6 months    Recent Outpatient Visits           1 year ago Annual physical exam   [place] [facility] [facility] LITTIE Mickey., MD   2 years ago Essential hypertension   [place] [facility] LITTIE Mickey., MD   3 years ago Annual physical exam   [facility] LITTIE Mickey., MD   3 years ago Essential hypertension   [facility] LITTIE Mickey., MD   4 years ago Essential hypertension   [place] [facility] LITTIE Mickey., MD       Future Appointments             In 1 week Francisca Redell BROCKS, MD [facility] [place]            Passed - Last Heart Rate in normal range    Pulse Readings from Last 1 Encounters:  04/08/22 80

## 2023-11-18 ENCOUNTER — Ambulatory Visit: Payer: 59 | Admitting: Urology

## 2023-11-18 ENCOUNTER — Encounter: Payer: Self-pay | Admitting: Urology

## 2023-11-18 VITALS — BP 145/84 | HR 73 | Ht 68.0 in | Wt 153.0 lb

## 2023-11-18 DIAGNOSIS — N200 Calculus of kidney: Secondary | ICD-10-CM

## 2023-11-18 DIAGNOSIS — R1012 Left upper quadrant pain: Secondary | ICD-10-CM

## 2023-11-18 NOTE — Patient Instructions (Addendum)
Percutaneous Nephrolithotomy Percutaneous nephrolithotomy is a procedure to remove kidney stones. Kidney stones are rock-like masses that form inside the kidneys. You may need this procedure if: You have kidney stones that are bigger than 2 cm (0.78 inch) wide. Your kidney stones have an odd shape to them. Other treatments have not worked. You have an infection brought on by the kidney stones. Tell a health care provider about: Any allergies you have. All medicines you are taking, including vitamins, herbs, eye drops, creams, and over-the-counter medicines. Any problems you or family members have had with anesthesia. Any bleeding problems you have. Any surgeries you have had. Any medical conditions you have. Whether you are pregnant or may be pregnant. What are the risks? Your health care provider will talk with you about risks. These may include: Infection. Bleeding. This may include blood in your pee (urine). Allergic reactions to medicines. Damage to other structures or organs. Damage to the kidney. Holes in the kidney. In most cases, these holes heal on their own. Numbness or tingling. Not being able to remove all the stones. If this happens, you may need more treatment. What happens before the procedure? When to stop eating and drinking Follow instructions from your provider about what you may eat and drink. These may include: 8 hours before your procedure Stop eating most foods. Do not eat meat, fried foods, or fatty foods. Eat only light foods, such as toast or crackers. All liquids are okay except energy drinks and alcohol. 6 hours before your procedure Stop eating. Drink only clear liquids, such as water, clear fruit juice, black coffee, plain tea, and sports drinks. Do not drink energy drinks or alcohol. 2 hours before your procedure Stop drinking all liquids. You may be allowed to take medicines with small sips of water. If you do not follow your provider's  instructions, your procedure may be delayed or canceled. Medicines Ask your provider about: Changing or stopping your regular medicines. These include any diabetes medicines or blood thinners you take. Taking medicines such as aspirin and ibuprofen. These medicines can thin your blood. Do not take them unless your provider tells you to. Taking over-the-counter medicines, vitamins, herbs, and supplements. Tests You may have tests done. These may include: Blood and pee tests. An electrocardiogram (EKG). This checks how well your heart is working. Imaging studies. These may include an ultrasound or CT scan. They can help find: The size and number (stone burden) of the kidney stones. Where the kidney stones are. Surgery safety Ask your provider: How your surgery site will be marked. What steps will be taken to help prevent infection. These steps may include: Removing hair at the surgery site. Washing skin with a soap that kills germs. Receiving antibiotics. General instructions Plan to have a responsible adult: Take you home from the hospital. You will not be allowed to drive. Care for you for the time you are told. What happens during the procedure?  An IV will be inserted into one of your veins. You will be given: A sedative. This helps you relax. Anesthesia. This keeps you from feeling pain. It will numb certain areas make you fall asleep for surgery. A soft tube (catheter) will be put in your bladder. This will drain pee out of your body during and after the procedure. Your surgeon will make a small incision in your lower back. A tube will be put through the incision into your kidney. Each kidney stone will be removed through this tube. Larger stones may  need to be broken up with a beam of light (laser) or other tools. After all of the stones have been removed, your provider may put in another tube to help drain pee. This may be: A stent. This is put into the tube that connects the  bladder to the kidney (ureter). It helps drain pee from your kidney to your bladder. A nephrostomy tube. This is put in your kidney. It comes out through the incision in your lower back. It helps drain pee or any fluid that builds up while your kidney heals. The incision may be closed with stitches (sutures). A bandage (dressing) will be placed over the incision area. The procedure may vary among providers and hospitals. What happens after the procedure? Your blood pressure, heart rate, breathing rate, and blood oxygen level will be monitored until you leave the hospital or clinic. Any tubes will be removed after 1-2 days if there is only a small amount of blood in your pee. This information is not intended to replace advice given to you by your health care provider. Make sure you discuss any questions you have with your health care provider. Document Revised: 05/16/2022 Document Reviewed: 05/16/2022 Elsevier Patient Education  2024 [employer]  Laser Therapy for Kidney Stones(ureteroscopy and laser lithotripsy) Laser therapy for kidney stones is a procedure to break up rock-like masses that form inside the kidneys (kidney stones). It is done using a device that beams a strong light (laser) on the kidney stones. This breaks the stones up into small pieces. These small pieces may leave your body when you pee (urinate) or may be taken out during the procedure.  You may need laser therapy if you have kidney stones that are painful or that are stopping you from being able to pee. Tell a health care provider about: Any allergies you have. All medicines you are taking, including vitamins, herbs, eye drops, creams, and over-the-counter medicines. Any problems you or family members have had with anesthesia. Any bleeding problems you have. Any surgeries you have had. Any medical conditions you have. Whether you are pregnant or may be pregnant. What are the risks? Your health care provider will talk  with you about risks. These may include: Infection. Bleeding. Allergic reactions to medicines. Damage to: The part of your body that drains pee (urine) from the bladder (urethra). The bladder. The tube that connects the bladder to the kidneys (ureter). Urinary tract infection (UTI). Urethral stricture. This is when the urethra is narrowed by scarring. Trouble peeing. Blockage of the kidney. This may be caused by a piece of kidney stone. What happens before the procedure? When to stop eating and drinking Follow instructions from your provider about what you may eat and drink. These may include: 8 hours before the procedure Stop eating most foods. Do not eat meat, fried foods, or fatty foods. Eat only light foods, such as toast or crackers. All liquids are okay except energy drinks and alcohol. 6 hours before the procedure Stop eating. Drink only clear liquids, such as water, clear fruit juice, black coffee, plain tea, and sports drinks. Do not drink energy drinks or alcohol. 2 hours before the procedure Stop drinking all liquids. You may be allowed to take medicines with small sips of water. If you do not follow your provider's instructions, your procedure may be delayed or canceled. Medicines Ask your provider about: Changing or stopping your regular medicines. These include any diabetes medicines or blood thinners you take. Taking medicines such as aspirin  and ibuprofen. These medicines can thin your blood. Do not take them unless your provider tells you to. Taking over-the-counter medicines, vitamins, herbs, and supplements. Tests You may have a physical exam before the procedure. You may also have tests done. These may include: Imaging tests. Blood or pee tests. Surgery safety Ask your provider: How your surgery site will be marked. What steps will be taken to help prevent infection. These steps may include: Removing hair at the surgery site. Washing skin with a soap that  kills germs. Taking antibiotics. General instructions Do not use any products that contain nicotine or tobacco for at least 4 weeks before the procedure. These products include cigarettes, chewing tobacco, and vaping devices, such as e-cigarettes. If you need help quitting, ask your provider. If you will be going home right after the procedure, plan to have a responsible adult: Take you home from the hospital or clinic. You will not be allowed to drive. Care for you for the time you are told. What happens during the procedure?  An IV will be inserted into one of your veins. You will be given: A sedative. This helps you relax. Anesthesia. This keeps you from feeling pain. It will make you fall asleep for surgery. A tool with a camera on the end (ureteroscope) will be put into your urethra. It will be moved through your bladder to your kidney. It will send pictures to a screen in the operating room. This will show what parts of your kidney need to be treated. A tube will be put through the ureteroscope. It will be moved into your kidney. The laser device will be put into your kidney through the tube. The laser will be used to break up the kidney stones. A tool with a tiny wire basket may be put through the tube into your kidney. This can help remove the small pieces of the kidney stone. A small mesh tube (stent) may be placed to allow your kidney to drain. The tube and ureteroscope will be taken out at the end of the surgery. The procedure may vary among providers and hospitals. What happens after the procedure? Your blood pressure, heart rate, breathing rate, and blood oxygen level will be monitored until you leave the hospital or clinic. If you had a stent placed, it may have a string that will be secured to your skin. This helps your provider remove the stent. You may be given a strainer to collect any stone pieces that you pass in your pee. Your provider may have these tested. This  information is not intended to replace advice given to you by your health care provider. Make sure you discuss any questions you have with your health care provider. Document Revised: 05/17/2022 Document Reviewed: 05/17/2022 Elsevier Patient Education  2024 [employer].

## 2023-11-18 NOTE — Progress Notes (Signed)
11/18/23 8:15 AM   Carollee Herter Daphane Shepherd 04-21-1971 [id number]  CC: Left flank pain, left renal stone  HPI: 53 year old female with reported history of multiple spontaneously passed kidney stone.  She denies any recurrent UTIs.  She has had some intermittent left-sided flank pain that prompted a KUB with her PCP on 10/02/2023.  This reportedly showed a 2.7 cm left renal stone, I am unable to personally review those images.  She denies any flank pain currently.  Has never required surgery for kidney stones previously.   PMH: Past Medical History:  Diagnosis Date   Asthma    Family history of adverse reaction to anesthesia    Mother - PONV   Hypertension    MVP (mitral valve prolapse)    PONV (postoperative nausea and vomiting)    Stroke (HCC) 2001   no deficits   Wears contact lenses     Surgical History: Past Surgical History:  Procedure Laterality Date   ABDOMINAL HYSTERECTOMY     COLONOSCOPY WITH PROPOFOL N/A 04/10/2020   Procedure: COLONOSCOPY WITH BIOPSY;  Surgeon: Midge Minium, MD;  Location: [facility];  Service: Endoscopy;  Laterality: N/A;  priority 4   KNEE ARTHROSCOPY Bilateral    NASAL SINUS SURGERY     OOPHORECTOMY     POLYPECTOMY N/A 04/10/2020   Procedure: POLYPECTOMY;  Surgeon: Midge Minium, MD;  Location: [facility];  Service: Endoscopy;  Laterality: N/A;   TONSILLECTOMY AND ADENOIDECTOMY     TUBAL LIGATION      Family History: Family History  Problem Relation Age of Onset   Hypertension Mother    Parkinson's disease Mother    Hypertension Father    Cancer Father        lung   Cancer Paternal Grandmother        breast   Breast cancer Paternal Grandmother    Irritable bowel syndrome Daughter    Crohn's disease Son    Von Willebrand disease Paternal Aunt    Heart disease Maternal Grandmother     Social History:  reports that she has never smoked. She has never used smokeless tobacco. She reports current alcohol use of about 3.0  standard drinks of alcohol per week. She reports that she does not use drugs.  Physical Exam: BP (!) 145/84   Pulse 73   Ht 5\' 8"  (1.727 m)   Wt 153 lb (69.4 kg)   BMI 23.26 kg/m    Constitutional:  Alert and oriented, No acute distress. Cardiovascular: No clubbing, cyanosis, or edema. Respiratory: Normal respiratory effort, no increased work of breathing. GI: Abdomen is soft, nontender, nondistended, no abdominal masses   Pertinent Imaging: KUB report with 2.7 cm left renal stone  Assessment & Plan:   53 year old female with intermittent left-sided flank pain, reported 2.7 cm triangular left renal partial staghorn stone on KUB.  We discussed various treatment options for urolithiasis including observation with or without medical expulsive therapy, shockwave lithotripsy (SWL), ureteroscopy and laser lithotripsy with stent placement, and percutaneous nephrolithotomy.We discussed that management is based on stone size, location, density, patient co-morbidities, and patient preference. Stones <34mm in size have a >80% spontaneous passage rate. Data surrounding the use of tamsulosin for medical expulsive therapy is controversial, but meta analyses suggests it is most efficacious for distal stones between 5-12mm in size. Possible side effects include dizziness/lightheadedness, and retrograde ejaculation.SWL has a lower stone free rate in a single procedure, but also a lower complication rate compared to ureteroscopy and avoids a stent  and associated stent related symptoms. Possible complications include renal hematoma, steinstrasse, and need for additional treatment.Ureteroscopy with laser lithotripsy and stent placement has a higher stone free rate than SWL in a single procedure, however increased complication rate including possible infection, ureteral injury, bleeding, and stent related morbidity. Common stent related symptoms include dysuria, urgency/frequency, and flank pain.PCNL is the favored  treatment for stones >2cm. It involves a small incision in the flank, with complete fragmentation of stones and removal. It has the highest stone free rate, but also the highest complication rate. Possible complications include bleeding, infection/sepsis, injury to surrounding organs including the pleura, and collecting system injury.   CT ordered, will call with results to finalize treatment strategy PCNL versus ureteroscopy with new suction scope pending stone size and density  Legrand Rams, MD 11/18/2023  [facility] [address] [place], [place] [address] [phone]

## 2023-11-25 ENCOUNTER — Ambulatory Visit
Admission: RE | Admit: 2023-11-25 | Discharge: 2023-11-25 | Disposition: A | Discharge status: Home / Self Care | Payer: 59 | Source: Ambulatory Visit | Attending: Urology | Admitting: Urology

## 2023-11-25 DIAGNOSIS — R1012 Left upper quadrant pain: Secondary | ICD-10-CM | POA: Insufficient documentation

## 2023-11-28 ENCOUNTER — Ambulatory Visit: Payer: 59

## 2023-12-16 ENCOUNTER — Other Ambulatory Visit: Payer: Self-pay | Admitting: Urology

## 2023-12-16 ENCOUNTER — Other Ambulatory Visit: Payer: Self-pay

## 2023-12-16 DIAGNOSIS — N200 Calculus of kidney: Secondary | ICD-10-CM

## 2023-12-16 NOTE — Progress Notes (Unsigned)
 Surgical Physician Order Form [facility]  Dr. Legrand Rams, MD  * Scheduling expectation : Next Available  *Length of Case: 2 hours  *Clearance needed: no  *Anticoagulation Instructions: May continue all anticoagulants  *Aspirin Instructions: Ok to continue Aspirin  *Post-op visit Date/Instructions:  tbd  *Diagnosis: Left Nephrolithiasis  *Procedure: left Ureteroscopy w/laser lithotripsy & stent placement (09811)   Additional orders: N/A  -Admit type: OUTpatient  -Anesthesia: General  -VTE Prophylaxis Standing Order SCD's       Other:   -Standing Lab Orders Per Anesthesia    Lab other: UA&Urine Culture  -Standing Test orders EKG/Chest x-ray per Anesthesia       Test other:   - Medications:  Ancef 2gm IV  -Other orders:  N/A

## 2023-12-17 ENCOUNTER — Telehealth: Payer: Self-pay

## 2023-12-17 NOTE — Telephone Encounter (Signed)
  Per Dr. Richardo Coleman, Patient is to be scheduled for Left Ureteroscopy with Laser Lithotripsy and Stent Placement   Lindsey Coleman was contacted and possible surgical dates were discussed, Friday March 28th, 2025 was agreed upon for surgery.   Patient was instructed that Dr. Richardo Coleman will require them to provide a pre-op UA & CX prior to surgery. This was ordered and scheduled drop off appointment was made for 12/18/2023.    Patient was directed to call [phone] between 1-3pm the day before surgery to find out surgical arrival time.  Instructions were given not to eat or drink from midnight on the night before surgery and have a driver for the day of surgery. On the surgery day patient was instructed to enter through the Medical Mall entrance of [facility] report the Same Day Surgery desk.   Pre-Admit Testing will be in contact via phone to set up an interview with the anesthesia team to review your history and medications prior to surgery.   Reminder of this information was sent via MyChart to the patient.

## 2023-12-17 NOTE — Progress Notes (Signed)
   [facility] Surgical Posting Form  Surgery Date: Date: 12/26/2023  Surgeon: Dr. Legrand Rams, MD  Inpt ( No  )   Outpt (Yes)   Obs ( No  )   Diagnosis: N20.0 Left Nephrolithiasis   -CPT: [phone]  Surgery: Left Ureteroscopy with Laser Lithotripsy and Stent Placement  Stop Anticoagulations: No, may continue all  Cardiac/Medical/Pulmonary Clearance needed: no  *Orders entered into EPIC  Date: 12/17/23   *Case booked in [place]  Date: 12/17/23  *Notified pt of Surgery: Date: 12/17/23  PRE-OP UA & CX: yes, will obtain at [facility] on 12/18/2023  *Placed into Prior Authorization Work Angela Nevin Date: 12/17/23  Assistant/laser/rep:No

## 2023-12-18 ENCOUNTER — Other Ambulatory Visit
Admission: RE | Admit: 2023-12-18 | Discharge: 2023-12-18 | Disposition: A | Discharge status: Home / Self Care | Attending: Urology | Admitting: Urology

## 2023-12-18 DIAGNOSIS — N200 Calculus of kidney: Secondary | ICD-10-CM | POA: Insufficient documentation

## 2023-12-18 LAB — URINALYSIS, COMPLETE (UACMP) WITH MICROSCOPIC
Bilirubin Urine: NEGATIVE
Glucose, UA: NEGATIVE mg/dL
Ketones, ur: NEGATIVE mg/dL
Nitrite: NEGATIVE
Protein, ur: 100 mg/dL — AB
Specific Gravity, Urine: 1.02 (ref 1.005–1.030)
pH: 6.5 (ref 5.0–8.0)

## 2023-12-19 LAB — URINE CULTURE

## 2023-12-22 ENCOUNTER — Other Ambulatory Visit: Payer: Self-pay

## 2023-12-22 ENCOUNTER — Encounter
Admission: RE | Admit: 2023-12-22 | Discharge: 2023-12-22 | Disposition: A | Discharge status: Home / Self Care | Source: Ambulatory Visit | Attending: Urology | Admitting: Urology

## 2023-12-22 VITALS — Ht 67.5 in | Wt 153.0 lb

## 2023-12-22 DIAGNOSIS — I4891 Unspecified atrial fibrillation: Secondary | ICD-10-CM

## 2023-12-22 DIAGNOSIS — I341 Nonrheumatic mitral (valve) prolapse: Secondary | ICD-10-CM

## 2023-12-22 DIAGNOSIS — I10 Essential (primary) hypertension: Secondary | ICD-10-CM

## 2023-12-22 HISTORY — DX: Personal history of urinary calculi: Z87.442

## 2023-12-22 NOTE — Patient Instructions (Addendum)
 Your procedure is scheduled on: Friday 12/26/23 To find out your arrival time, please call [phone] between 1PM - 3PM on: Thursday 12/25/23   Report to the Registration Desk on the 1st floor of the Medical Mall. Free Valet parking is available.  If your arrival time is 6:00 am, do not arrive before that time as the Medical Mall entrance doors do not open until 6:00 am.  REMEMBER: Instructions that are not followed completely may result in serious medical risk, up to and including death; or upon the discretion of your surgeon and anesthesiologist your surgery may need to be rescheduled.  Do not eat food or drink any liquids after midnight the night before surgery.  No gum chewing or hard candies.  One week prior to surgery: Stop Anti-inflammatories (NSAIDS) such as Advil, Aleve, Ibuprofen, Motrin, Naproxen, Naprosyn and Aspirin based products such as Excedrin, Goody's Powder, BC Powder. You may however, continue to take Tylenol if needed for pain up until the day of surgery.  Stop ANY OVER THE COUNTER supplements and vitamins until after surgery.  Continue taking all prescribed medications with the exception of the following: Meloxicam, hold starting today until after surgery.  TAKE ONLY THESE MEDICATIONS THE MORNING OF SURGERY WITH A SIP OF WATER:  cetirizine (ZYRTEC) 10 MG tablet  You may use your nasal spray if needed  No Alcohol for 24 hours before or after surgery.  No Smoking including e-cigarettes for 24 hours before surgery.  No chewable tobacco products for at least 6 hours before surgery.  No nicotine patches on the day of surgery.  Do not use any "recreational" drugs for at least a week (preferably 2 weeks) before your surgery.  Please be advised that the combination of cocaine and anesthesia may have negative outcomes, up to and including death. If you test positive for cocaine, your surgery will be cancelled.  On the morning of surgery brush your teeth with  toothpaste and water, you may rinse your mouth with mouthwash if you wish. Do not swallow any toothpaste or mouthwash. Shower as usual.  Do not wear lotions, powders, or perfumes.   Do not shave body hair from the neck down 48 hours before surgery.  Wear comfortable clothing (specific to your surgery type) to the hospital.  Do not wear jewelry, make-up, hairpins, clips or nail polish.  For welded (permanent) jewelry: bracelets, anklets, waist bands, etc.  Please have this removed prior to surgery.  If it is not removed, there is a chance that hospital personnel will need to cut it off on the day of surgery. Contact lenses, hearing aids and dentures may not be worn into surgery.  Do not bring valuables to the hospital. [facility] is not responsible for any missing/lost belongings or valuables.   Notify your doctor if there is any change in your medical condition (cold, fever, infection).  If you are being discharged the day of surgery, you will not be allowed to drive home. You will need a responsible individual to drive you home and stay with you for 24 hours after surgery.   If you are taking public transportation, you will need to have a responsible individual with you.  If you are being admitted to the hospital overnight, leave your suitcase in the car. After surgery it may be brought to your room.  In case of increased patient census, it may be necessary for you, the patient, to continue your postoperative care in the Same Day Surgery department.  After  surgery, you can help prevent lung complications by doing breathing exercises.  Take deep breaths and cough every 1-2 hours. Your doctor may order a device called an Incentive Spirometer to help you take deep breaths.  Surgery Visitation Policy:  Patients undergoing a surgery or procedure may have two family members or support persons with them as long as the person is not COVID-19 positive or experiencing its symptoms.    Inpatient Visitation:    Visiting hours are 7 a.m. to 8 p.m. Up to four visitors are allowed at one time in a patient room. The visitors may rotate out with other people during the day. One designated support person (adult) may remain overnight.  Due to an increase in RSV and influenza rates and associated hospitalizations, children ages 46 and under will not be able to visit patients in [facility]. Masks continue to be strongly recommended.  Please call the Pre-admissions Testing Dept. at [phone] if you have any questions about these instructions.

## 2023-12-24 ENCOUNTER — Encounter: Payer: Self-pay | Admitting: Urgent Care

## 2023-12-24 ENCOUNTER — Encounter
Admission: RE | Admit: 2023-12-24 | Discharge: 2023-12-24 | Disposition: A | Discharge status: Home / Self Care | Source: Ambulatory Visit | Attending: Urology | Admitting: Urology

## 2023-12-24 ENCOUNTER — Telehealth: Admitting: Urology

## 2023-12-24 DIAGNOSIS — I10 Essential (primary) hypertension: Secondary | ICD-10-CM | POA: Diagnosis not present

## 2023-12-24 DIAGNOSIS — Z01810 Encounter for preprocedural cardiovascular examination: Secondary | ICD-10-CM | POA: Diagnosis present

## 2023-12-24 DIAGNOSIS — I4891 Unspecified atrial fibrillation: Secondary | ICD-10-CM | POA: Insufficient documentation

## 2023-12-24 DIAGNOSIS — I341 Nonrheumatic mitral (valve) prolapse: Secondary | ICD-10-CM | POA: Diagnosis not present

## 2023-12-24 DIAGNOSIS — I4892 Unspecified atrial flutter: Secondary | ICD-10-CM | POA: Diagnosis not present

## 2023-12-26 ENCOUNTER — Ambulatory Visit: Admitting: Certified Registered Nurse Anesthetist

## 2023-12-26 ENCOUNTER — Ambulatory Visit
Admission: RE | Admit: 2023-12-26 | Discharge: 2023-12-26 | Disposition: A | Discharge status: Home / Self Care | Attending: Urology | Admitting: Urology

## 2023-12-26 ENCOUNTER — Ambulatory Visit

## 2023-12-26 ENCOUNTER — Encounter
Admission: RE | Disposition: A | Discharge status: Home / Self Care | Payer: Self-pay | Source: Home / Self Care | Attending: Urology

## 2023-12-26 ENCOUNTER — Other Ambulatory Visit: Payer: Self-pay

## 2023-12-26 ENCOUNTER — Ambulatory Visit: Payer: Self-pay | Admitting: Urgent Care

## 2023-12-26 ENCOUNTER — Encounter: Payer: Self-pay | Admitting: Urology

## 2023-12-26 DIAGNOSIS — N200 Calculus of kidney: Secondary | ICD-10-CM | POA: Insufficient documentation

## 2023-12-26 DIAGNOSIS — I10 Essential (primary) hypertension: Secondary | ICD-10-CM | POA: Insufficient documentation

## 2023-12-26 DIAGNOSIS — Z87442 Personal history of urinary calculi: Secondary | ICD-10-CM | POA: Insufficient documentation

## 2023-12-26 DIAGNOSIS — Z8673 Personal history of transient ischemic attack (TIA), and cerebral infarction without residual deficits: Secondary | ICD-10-CM | POA: Insufficient documentation

## 2023-12-26 DIAGNOSIS — J45909 Unspecified asthma, uncomplicated: Secondary | ICD-10-CM | POA: Diagnosis not present

## 2023-12-26 HISTORY — PX: CYSTOSCOPY/URETEROSCOPY/HOLMIUM LASER/STENT PLACEMENT: SHX6546

## 2023-12-26 SURGERY — CYSTOSCOPY/URETEROSCOPY/HOLMIUM LASER/STENT PLACEMENT
Anesthesia: General | Site: Ureter | Laterality: Left

## 2023-12-26 MED ORDER — DEXAMETHASONE SODIUM PHOSPHATE 10 MG/ML IJ SOLN
INTRAMUSCULAR | Status: DC | PRN
  Administered 2023-12-26: 10 mg via INTRAVENOUS

## 2023-12-26 MED ORDER — FENTANYL CITRATE (PF) 100 MCG/2ML IJ SOLN
INTRAMUSCULAR | Status: AC
Start: 2023-12-26 — End: ?
  Filled 2023-12-26: qty 2

## 2023-12-26 MED ORDER — LACTATED RINGERS IV SOLN: INTRAVENOUS | Status: DC

## 2023-12-26 MED ORDER — CEFAZOLIN SODIUM-DEXTROSE 2-4 GM/100ML-% IV SOLN
INTRAVENOUS | Status: AC
Start: 2023-12-26 — End: ?
  Filled 2023-12-26: qty 100

## 2023-12-26 MED ORDER — CHLORHEXIDINE GLUCONATE 0.12 % MT SOLN
OROMUCOSAL | Status: AC
  Filled 2023-12-26: qty 15

## 2023-12-26 MED ORDER — ONDANSETRON HCL 4 MG/2ML IJ SOLN
INTRAMUSCULAR | Status: DC | PRN
  Administered 2023-12-26: 4 mg via INTRAVENOUS

## 2023-12-26 MED ORDER — KETOROLAC TROMETHAMINE 30 MG/ML IJ SOLN
INTRAMUSCULAR | Status: DC | PRN
  Administered 2023-12-26: 15 mg via INTRAVENOUS

## 2023-12-26 MED ORDER — ONDANSETRON HCL 4 MG/2ML IJ SOLN: 4.0000 mg | Freq: Once | INTRAMUSCULAR | Status: DC | PRN

## 2023-12-26 MED ORDER — PROPOFOL 10 MG/ML IV BOLUS
INTRAVENOUS | Status: DC | PRN
  Administered 2023-12-26: 15 mg via INTRAVENOUS

## 2023-12-26 MED ORDER — OXYCODONE HCL 5 MG PO TABS: 5.0000 mg | ORAL_TABLET | Freq: Once | ORAL | Status: DC | PRN

## 2023-12-26 MED ORDER — LIDOCAINE HCL (CARDIAC) PF 100 MG/5ML IV SOSY
PREFILLED_SYRINGE | INTRAVENOUS | Status: DC | PRN
  Administered 2023-12-26: 80 mg via INTRAVENOUS

## 2023-12-26 MED ORDER — CEFAZOLIN SODIUM-DEXTROSE 2-4 GM/100ML-% IV SOLN
2.0000 g | INTRAVENOUS | Status: AC
  Administered 2023-12-26: 2 g via INTRAVENOUS

## 2023-12-26 MED ORDER — MIDAZOLAM HCL 2 MG/2ML IJ SOLN
INTRAMUSCULAR | Status: AC
  Filled 2023-12-26: qty 2

## 2023-12-26 MED ORDER — OXYBUTYNIN CHLORIDE ER 10 MG PO TB24: 10.0000 mg | ORAL_TABLET | Freq: Every day | ORAL | 0 refills | Status: DC

## 2023-12-26 MED ORDER — ORAL CARE MOUTH RINSE: 15.0000 mL | Freq: Once | OROMUCOSAL | Status: AC

## 2023-12-26 MED ORDER — TRAMADOL HCL 50 MG PO TABS
25.0000 mg | ORAL_TABLET | Freq: Four times a day (QID) | ORAL | 0 refills | Status: AC | PRN
Start: 2023-12-26 — End: 2023-12-31

## 2023-12-26 MED ORDER — FENTANYL CITRATE (PF) 100 MCG/2ML IJ SOLN
INTRAMUSCULAR | Status: DC | PRN
  Administered 2023-12-26: 25 ug via INTRAVENOUS
  Administered 2023-12-26 (×2): 50 ug via INTRAVENOUS

## 2023-12-26 MED ORDER — DIPHENHYDRAMINE HCL 50 MG/ML IJ SOLN
INTRAMUSCULAR | Status: DC | PRN
  Administered 2023-12-26: 12.5 mg via INTRAVENOUS

## 2023-12-26 MED ORDER — SUGAMMADEX SODIUM 200 MG/2ML IV SOLN
INTRAVENOUS | Status: DC | PRN
  Administered 2023-12-26: 200 mg via INTRAVENOUS

## 2023-12-26 MED ORDER — PROPOFOL 10 MG/ML IV BOLUS
INTRAVENOUS | Status: AC
  Filled 2023-12-26: qty 20

## 2023-12-26 MED ORDER — SODIUM CHLORIDE 0.9 % IR SOLN
Status: DC | PRN
  Administered 2023-12-26: 3000 mL

## 2023-12-26 MED ORDER — CHLORHEXIDINE GLUCONATE 0.12 % MT SOLN
15.0000 mL | Freq: Once | OROMUCOSAL | Status: AC
  Administered 2023-12-26: 15 mL via OROMUCOSAL

## 2023-12-26 MED ORDER — IOHEXOL 180 MG/ML  SOLN
INTRAMUSCULAR | Status: DC | PRN
  Administered 2023-12-26: 10 mL

## 2023-12-26 MED ORDER — MIDAZOLAM HCL 5 MG/5ML IJ SOLN
INTRAMUSCULAR | Status: DC | PRN
  Administered 2023-12-26: 2 mg via INTRAVENOUS

## 2023-12-26 MED ORDER — ROCURONIUM BROMIDE 100 MG/10ML IV SOLN
INTRAVENOUS | Status: DC | PRN
  Administered 2023-12-26: 40 mg via INTRAVENOUS

## 2023-12-26 MED ORDER — OXYCODONE HCL 5 MG/5ML PO SOLN: 5.0000 mg | Freq: Once | ORAL | Status: DC | PRN

## 2023-12-26 MED ORDER — DEXMEDETOMIDINE HCL IN NACL 80 MCG/20ML IV SOLN
INTRAVENOUS | Status: DC | PRN
  Administered 2023-12-26: 8 ug via INTRAVENOUS
  Administered 2023-12-26: 4 ug via INTRAVENOUS
  Administered 2023-12-26: 8 ug via INTRAVENOUS

## 2023-12-26 MED ORDER — FENTANYL CITRATE (PF) 100 MCG/2ML IJ SOLN
INTRAMUSCULAR | Status: AC
  Filled 2023-12-26: qty 2

## 2023-12-26 MED ORDER — FENTANYL CITRATE (PF) 100 MCG/2ML IJ SOLN: 25.0000 ug | INTRAMUSCULAR | Status: DC | PRN

## 2023-12-26 SURGICAL SUPPLY — 26 items
ADHESIVE MASTISOL STRL (MISCELLANEOUS) IMPLANT
BAG DRAIN SIEMENS DORNER NS (MISCELLANEOUS) ×1 IMPLANT
BAG PRESSURE INF REUSE 3000 (BAG) ×1 IMPLANT
BRUSH SCRUB EZ 1% IODOPHOR (MISCELLANEOUS) ×1 IMPLANT
CATH URET FLEX-TIP 2 LUMEN 10F (CATHETERS) IMPLANT
CATH URETL OPEN 5X70 (CATHETERS) IMPLANT
CNTNR URN SCR LID CUP LEK RST (MISCELLANEOUS) IMPLANT
DRAPE UTILITY 15X26 TOWEL STRL (DRAPES) ×1 IMPLANT
DRSG TEGADERM 2-3/8X2-3/4 SM (GAUZE/BANDAGES/DRESSINGS) IMPLANT
FIBER LASER MOSES 200 DFL (Laser) IMPLANT
FIBER LASER MOSES 365 DFL (Laser) IMPLANT
GLOVE BIOGEL PI IND STRL 7.5 (GLOVE) ×1 IMPLANT
GOWN STRL REUS W/ TWL LRG LVL3 (GOWN DISPOSABLE) ×1 IMPLANT
GOWN STRL REUS W/ TWL XL LVL3 (GOWN DISPOSABLE) ×1 IMPLANT
GUIDEWIRE STR DUAL SENSOR (WIRE) ×1 IMPLANT
KIT TURNOVER CYSTO (KITS) ×1 IMPLANT
PACK CYSTO AR (MISCELLANEOUS) ×1 IMPLANT
SET CYSTO W/LG BORE CLAMP LF (SET/KITS/TRAYS/PACK) ×1 IMPLANT
SHEATH NAVIGATOR HD 12/14X36 (SHEATH) IMPLANT
SOL .9 NS 3000ML IRR UROMATIC (IV SOLUTION) ×1 IMPLANT
STENT URET 6FRX24 CONTOUR (STENTS) IMPLANT
STENT URET 6FRX26 CONTOUR (STENTS) IMPLANT
SURGILUBE 2OZ TUBE FLIPTOP (MISCELLANEOUS) ×1 IMPLANT
SYR 10ML LL (SYRINGE) ×1 IMPLANT
VALVE UROSEAL ADJ ENDO (VALVE) IMPLANT
WATER STERILE IRR 500ML POUR (IV SOLUTION) ×1 IMPLANT

## 2023-12-26 NOTE — Anesthesia Procedure Notes (Signed)
 Procedure Name: Intubation Date/Time: 12/26/2023 1:31 PM  Performed by: Cheral Bay, CRNAPre-anesthesia Checklist: Patient identified, Emergency Drugs available, Suction available and Patient being monitored Patient Re-evaluated:Patient Re-evaluated prior to induction Oxygen Delivery Method: Circle system utilized Preoxygenation: Pre-oxygenation with 100% oxygen Induction Type: IV induction Ventilation: Mask ventilation without difficulty Laryngoscope Size: McGrath and 3 Grade View: Grade I Tube type: Oral Tube size: 7.0 mm Number of attempts: 1 Airway Equipment and Method: Stylet Placement Confirmation: ETT inserted through vocal cords under direct vision, positive ETCO2 and breath sounds checked- equal and bilateral Secured at: 21 cm Tube secured with: Tape Dental Injury: Teeth and Oropharynx as per pre-operative assessment

## 2023-12-26 NOTE — Anesthesia Postprocedure Evaluation (Signed)
 Anesthesia Post Note  Patient: Lindsey Coleman  Procedure(s) Performed: CYSTOSCOPY/URETEROSCOPY/HOLMIUM LASER/STENT PLACEMENT (Left: Ureter)  Patient location during evaluation: PACU Anesthesia Type: General Level of consciousness: awake and alert Pain management: satisfactory to patient Vital Signs Assessment: post-procedure vital signs reviewed and stable Respiratory status: spontaneous breathing Cardiovascular status: stable Anesthetic complications: no   No notable events documented.   Last Vitals:  Vitals:   12/26/23 1445 12/26/23 1453  BP: (!) 157/89 (!) 165/91  Pulse: 71 78  Resp:  20  Temp:  (!) 36.2 C  SpO2: 100% 100%    Last Pain:  Vitals:   12/26/23 1453  TempSrc:   PainSc: 0-No pain                 VAN STAVEREN,Hieu Herms

## 2023-12-26 NOTE — Anesthesia Preprocedure Evaluation (Signed)
 Anesthesia Evaluation  Patient identified by MRN, date of birth, ID band Patient awake    Reviewed: Allergy & Precautions, NPO status , Patient's Chart, lab work & pertinent test results  History of Anesthesia Complications (+) PONV and history of anesthetic complications  Airway Mallampati: II  TM Distance: >3 FB Neck ROM: full    Dental  (+) Teeth Intact   Pulmonary neg pulmonary ROS, asthma    Pulmonary exam normal        Cardiovascular Exercise Tolerance: Good hypertension, Pt. on medications negative cardio ROS Normal cardiovascular exam Rhythm:Regular Rate:Normal     Neuro/Psych CVA, No Residual Symptoms negative neurological ROS  negative psych ROS   GI/Hepatic negative GI ROS, Neg liver ROS,,,  Endo/Other  negative endocrine ROS    Renal/GU Nephroplithiasis  negative genitourinary   Musculoskeletal   Abdominal Normal abdominal exam  (+)   Peds negative pediatric ROS (+)  Hematology negative hematology ROS (+)   Anesthesia Other Findings Past Medical History: No date: Asthma No date: Family history of adverse reaction to anesthesia     Comment:  Mother - PONV No date: History of kidney stones No date: Hypertension No date: MVP (mitral valve prolapse) No date: PONV (postoperative nausea and vomiting) 2001: Stroke [facility])     Comment:  no deficits No date: Wears contact lenses  Past Surgical History: No date: ABDOMINAL HYSTERECTOMY 04/10/2020: COLONOSCOPY WITH PROPOFOL; N/A     Comment:  Procedure: COLONOSCOPY WITH BIOPSY;  Surgeon: Midge Minium, MD;  Location: [facility];  Service:               Endoscopy;  Laterality: N/A;  priority 4 No date: KNEE ARTHROSCOPY; Bilateral No date: NASAL SINUS SURGERY No date: OOPHORECTOMY No date: ORIF WRIST FRACTURE; Right 04/10/2020: POLYPECTOMY; N/A     Comment:  Procedure: POLYPECTOMY;  Surgeon: Midge Minium, MD;                 Location: [facility];  Service: Endoscopy;                Laterality: N/A; No date: TONSILLECTOMY AND ADENOIDECTOMY No date: TUBAL LIGATION     Reproductive/Obstetrics negative OB ROS                             Anesthesia Physical Anesthesia Plan  ASA: 2  Anesthesia Plan: General   Post-op Pain Management:    Induction: Intravenous  PONV Risk Score and Plan: 2 and Ondansetron, Dexamethasone and TIVA  Airway Management Planned: Oral ETT  Additional Equipment:   Intra-op Plan:   Post-operative Plan: Extubation in OR  Informed Consent: I have reviewed the patients History and Physical, chart, labs and discussed the procedure including the risks, benefits and alternatives for the proposed anesthesia with the patient or authorized representative who has indicated his/her understanding and acceptance.     Dental Advisory Given  Plan Discussed with: CRNA  Anesthesia Plan Comments:        Anesthesia Quick Evaluation

## 2023-12-26 NOTE — Op Note (Signed)
 Date of procedure: 12/26/23  Preoperative diagnosis:  Left renal stone  Postoperative diagnosis:  Same  Procedure: Cystoscopy, left ureteroscopy, laser lithotripsy, left retrograde pyelogram with intraoperative interpretation, left ureteral stent placement  Surgeon: Legrand Rams, MD  Anesthesia: General  Complications: None  Intraoperative findings:  Normal bladder, uncomplicated dusting of large 2 cm left lower pole stone  EBL: Minimal  Specimens: None  Drains: Left 6 French by 26 cm ureteral stent  Indication: Lindsey Coleman is a 53 y.o. patient with large 2 cm left lower pole stone with intermittent flank pain who opted for definitive treatment with ureteroscopy.  After reviewing the management options for treatment, they elected to proceed with the above surgical procedure(s). We have discussed the potential benefits and risks of the procedure, side effects of the proposed treatment, the likelihood of the patient achieving the goals of the procedure, and any potential problems that might occur during the procedure or recuperation. Informed consent has been obtained.  Description of procedure:  The patient was taken to the operating room and general anesthesia was induced. SCDs were placed for DVT prophylaxis. The patient was placed in the dorsal lithotomy position, prepped and draped in the usual sterile fashion, and preoperative antibiotics(Ancef) were administered. A preoperative time-out was performed.   A 21 French rigid cystoscope was used to intubate the urethra and thorough cystoscopy was performed.  The bladder was grossly normal throughout.  A sensor wire advanced easily into the left ureteral orifice and passed up to the kidney under fluoroscopic vision.  A dual-lumen ureteral access catheter was advanced over the wire and a second safety sensor wire was added.  A 12/14 French by 35 cm ureteral access sheath was gently advanced over the wire up to the kidney.  The  digital single-channel flexible ureteroscope was advanced through the sheath and thorough pyeloscopy was performed.  This showed a large 2 cm stone in the left lower pole.  A 365 m laser fiber on settings of 0.5 J and 80 Hz was used to methodically dust the stone.  This took approximately 30 minutes.  All stone fragments appear to be smaller than the laser fiber.  A retrograde pyelogram performed from the proximal ureter showed no extravasation or filling defects.  This sheath was removed, and careful pullback ureteroscopy showed no evidence of ureteral injury or residual fragments.  A 6 French by 26 cm ureteral stent was placed fluoroscopically on the left side with a curl in the kidney as well as in the bladder.  The bladder was drained and this concluded the procedure.  Disposition: Stable to PACU  Plan: Stent removal in clinic in 5 to 10 days Consider 24-hour urine metabolic workup at follow-up  Legrand Rams, MD

## 2023-12-26 NOTE — Transfer of Care (Signed)
 Immediate Anesthesia Transfer of Care Note  Patient: Lindsey Coleman  Procedure(s) Performed: CYSTOSCOPY/URETEROSCOPY/HOLMIUM LASER/STENT PLACEMENT (Left: Ureter)  Patient Location: PACU  Anesthesia Type:General  Level of Consciousness: sedated  Airway & Oxygen Therapy: Patient Spontanous Breathing  Post-op Assessment: Report given to RN and Post -op Vital signs reviewed and stable  Post vital signs: Reviewed and stable  Last Vitals:  Vitals Value Taken Time  BP 145/96 12/26/23 1418  Temp 36.2 C 12/26/23 1418  Pulse 72 12/26/23 1422  Resp 18 12/26/23 1418  SpO2 96 % 12/26/23 1422  Vitals shown include unfiled device data.  Last Pain:  Vitals:   12/26/23 1418  TempSrc:   PainSc: Asleep         Complications: No notable events documented.

## 2023-12-26 NOTE — H&P (Signed)
 12/26/23 1:17 PM   Carollee Herter Daphane Shepherd [birth year] [id number]  CC: Left renal stone  HPI: 53 year old female with recurrent nephrolithiasis, large 2.5 cm left renal stone, here today for ureteroscopy.   PMH: Past Medical History:  Diagnosis Date   Asthma    Family history of adverse reaction to anesthesia    Mother - PONV   History of kidney stones    Hypertension    MVP (mitral valve prolapse)    PONV (postoperative nausea and vomiting)    Stroke (HCC) 2001   no deficits   Wears contact lenses     Surgical History: Past Surgical History:  Procedure Laterality Date   ABDOMINAL HYSTERECTOMY     COLONOSCOPY WITH PROPOFOL N/A 04/10/2020   Procedure: COLONOSCOPY WITH BIOPSY;  Surgeon: Midge Minium, MD;  Location: [facility];  Service: Endoscopy;  Laterality: N/A;  priority 4   KNEE ARTHROSCOPY Bilateral    NASAL SINUS SURGERY     OOPHORECTOMY     ORIF WRIST FRACTURE Right    POLYPECTOMY N/A 04/10/2020   Procedure: POLYPECTOMY;  Surgeon: Midge Minium, MD;  Location: [facility];  Service: Endoscopy;  Laterality: N/A;   TONSILLECTOMY AND ADENOIDECTOMY     TUBAL LIGATION       Family History: Family History  Problem Relation Age of Onset   Hypertension Mother    Parkinson's disease Mother    Hypertension Father    Cancer Father        lung   Cancer Paternal Grandmother        breast   Breast cancer Paternal Grandmother    Irritable bowel syndrome Daughter    Crohn's disease Son    Von Willebrand disease Paternal Aunt    Heart disease Maternal Grandmother     Social History:  reports that she has never smoked. She has never used smokeless tobacco. She reports current alcohol use of about 3.0 standard drinks of alcohol per week. She reports that she does not use drugs.  Physical Exam: BP (!) 176/96   Pulse 78   Temp (!) 97.5 F (36.4 C) (Temporal)   Resp 16   SpO2 100%    Constitutional:  Alert and oriented, No acute  distress. Cardiovascular: Regular rate and rhythm Respiratory: Clear to auscultation bilaterally GI: Abdomen is soft, nontender, nondistended, no abdominal masses   Laboratory Data: Urine culture 3/20 mixed flora  Pertinent Imaging: I have personally viewed and interpreted the CT showing a 2.5 cm left lower pole stone.  Assessment & Plan:   53 year old female with intermittent left flank pain and CT showing a 2.5 cm left lower pole stone/renal pelvis stone.  We discussed various treatment options for urolithiasis including observation with or without medical expulsive therapy, shockwave lithotripsy (SWL), ureteroscopy and laser lithotripsy with stent placement, and percutaneous nephrolithotomy.We discussed that management is based on stone size, location, density, patient co-morbidities, and patient preference. Stones <33mm in size have a >80% spontaneous passage rate. Data surrounding the use of tamsulosin for medical expulsive therapy is controversial, but meta analyses suggests it is most efficacious for distal stones between 5-37mm in size. Possible side effects include dizziness/lightheadedness, and retrograde ejaculation.SWL has a lower stone free rate in a single procedure, but also a lower complication rate compared to ureteroscopy and avoids a stent and associated stent related symptoms. Possible complications include renal hematoma, steinstrasse, and need for additional treatment.Ureteroscopy with laser lithotripsy and stent placement has a higher stone free rate than SWL in a  single procedure, however increased complication rate including possible infection, ureteral injury, bleeding, and stent related morbidity. Common stent related symptoms include dysuria, urgency/frequency, and flank pain.PCNL is the favored treatment for stones >2cm. It involves a small incision in the flank, with complete fragmentation of stones and removal. It has the highest stone free rate, but also the highest  complication rate. Possible complications include bleeding, infection/sepsis, injury to surrounding organs including the pleura, and collecting system injury.   We specifically discussed the risks ureteroscopy including bleeding, infection/sepsis, stent related symptoms including flank pain/urgency/frequency/incontinence/dysuria, ureteral injury, ureteral stricture, inability to access stone, or need for staged or additional procedures.   Left ureteroscopy, laser lithotripsy, stent placement today  Legrand Rams, MD 12/26/2023  [facility] [address] [place], [place] [address] [phone]

## 2023-12-27 ENCOUNTER — Encounter: Payer: Self-pay | Admitting: Urology

## 2023-12-29 ENCOUNTER — Telehealth: Payer: Self-pay

## 2023-12-29 NOTE — Telephone Encounter (Signed)
 Pt called triage line stating, either the oxybutynin or AZO was making her face red, chest red/itchy, felt like she had a low grade fever. Pt states she look benadryl and everything got better.  Pt states she was having urinary frequency and no appetite since surgery. Pt denies feeling nauseous.  Pt was infromed this was normal after surgery and may last up to one week. Pt was advise if symptoms still continue after one week. Pt voiced understanding.

## 2024-01-05 ENCOUNTER — Other Ambulatory Visit: Payer: Self-pay | Admitting: Urology

## 2024-01-07 ENCOUNTER — Ambulatory Visit (INDEPENDENT_AMBULATORY_CARE_PROVIDER_SITE_OTHER): Admitting: Urology

## 2024-01-07 VITALS — BP 156/95 | HR 61

## 2024-01-07 DIAGNOSIS — Z466 Encounter for fitting and adjustment of urinary device: Secondary | ICD-10-CM | POA: Diagnosis not present

## 2024-01-07 DIAGNOSIS — N200 Calculus of kidney: Secondary | ICD-10-CM

## 2024-01-07 MED ORDER — CEPHALEXIN 250 MG PO CAPS
500.0000 mg | ORAL_CAPSULE | Freq: Once | ORAL | Status: AC
  Administered 2024-01-07: 500 mg via ORAL

## 2024-01-07 MED ORDER — LIDOCAINE HCL URETHRAL/MUCOSAL 2 % EX GEL
1.0000 | Freq: Once | CUTANEOUS | Status: AC
  Administered 2024-01-07: 1 via URETHRAL

## 2024-01-07 NOTE — Patient Instructions (Signed)

## 2024-01-07 NOTE — Progress Notes (Signed)
 Cystoscopy Procedure Note:  Indication: Stent removal s/p 12/26/2023 for left 2 cm left lower pole stone  Keflex given for prophylaxis  After informed consent and discussion of the procedure and its risks, Lindsey Coleman was positioned and prepped in the standard fashion. Cystoscopy was performed with a flexible cystoscope. The stent was grasped with flexible graspers and removed in its entirety. The patient tolerated the procedure well.  Findings: Uncomplicated stent removal  Assessment and Plan: 24-hour urine metabolic workup for stone prevention RTC 6 months KUB.   Sondra Come, MD 01/07/2024

## 2024-01-09 ENCOUNTER — Other Ambulatory Visit: Payer: Self-pay | Admitting: Family Medicine

## 2024-01-09 DIAGNOSIS — I10 Essential (primary) hypertension: Secondary | ICD-10-CM

## 2024-01-09 NOTE — Telephone Encounter (Signed)
 Provider not at this practice.  Requested Prescriptions  Pending Prescriptions Disp Refills   metoprolol succinate (TOPROL-XL) 25 MG 24 hr tablet [Pharmacy Med Name: METOPROLOL SUCC ER 25 MG TAB] 45 tablet 0    Sig: TAKE A HALF TABLET BY MOUTH DAILY     Cardiovascular:  Beta Blockers Failed - 01/09/2024  2:20 PM      Failed - Last BP in normal range    BP Readings from Last 1 Encounters:  01/07/24 (!) 156/95         Failed - Valid encounter within last 6 months    Recent Outpatient Visits   None     Future Appointments             In 6 months Sondra Come, MD [facility] [place]            Passed - Last Heart Rate in normal range    Pulse Readings from Last 1 Encounters:  01/07/24 61

## 2024-04-09 ENCOUNTER — Other Ambulatory Visit: Payer: Self-pay | Admitting: Family Medicine

## 2024-04-09 DIAGNOSIS — Z1231 Encounter for screening mammogram for malignant neoplasm of breast: Secondary | ICD-10-CM

## 2024-04-14 ENCOUNTER — Encounter: Payer: Self-pay | Admitting: Urology

## 2024-04-15 ENCOUNTER — Ambulatory Visit
Admission: RE | Admit: 2024-04-15 | Discharge: 2024-04-15 | Disposition: A | Discharge status: Home / Self Care | Source: Ambulatory Visit | Attending: Family Medicine | Admitting: Family Medicine

## 2024-04-15 DIAGNOSIS — Z1231 Encounter for screening mammogram for malignant neoplasm of breast: Secondary | ICD-10-CM | POA: Insufficient documentation

## 2024-05-20 ENCOUNTER — Telehealth: Admitting: Physician Assistant

## 2024-05-20 DIAGNOSIS — R3989 Other symptoms and signs involving the genitourinary system: Secondary | ICD-10-CM | POA: Diagnosis not present

## 2024-05-20 MED ORDER — CEPHALEXIN 500 MG PO CAPS: 500.0000 mg | ORAL_CAPSULE | Freq: Two times a day (BID) | ORAL | 0 refills | Status: AC

## 2024-05-20 NOTE — Progress Notes (Addendum)
 E-Visit for Urinary Problems  I agree that the low back seems more indicative of muscle strain giving it is with lifting or positional changes.   We are sorry that you are not feeling well.  Here is how we plan to help!  Based on what you shared with me it looks like you most likely have a simple urinary tract infection.  A UTI (Urinary Tract Infection) is a bacterial infection of the bladder.  Most cases of urinary tract infections are simple to treat but a key part of your care is to encourage you to drink plenty of fluids and watch your symptoms carefully.  I have prescribed Keflex  500 mg twice a day for 7 days.  Your symptoms should gradually improve. Call us  if the burning in your urine worsens, you develop worsening fever, back pain or pelvic pain or if your symptoms do not resolve after completing the antibiotic.  Urinary tract infections can be prevented by drinking plenty of water  to keep your body hydrated.  Also be sure when you wipe, wipe from front to back and don't hold it in!  If possible, empty your bladder every 4 hours.  HOME CARE Drink plenty of fluids Compete the full course of the antibiotics even if the symptoms resolve Remember, when you need to go.go. Holding in your urine can increase the likelihood of getting a UTI! GET HELP RIGHT AWAY IF: You cannot urinate You get a high fever Worsening back pain occurs You see blood in your urine You feel sick to your stomach or throw up You feel like you are going to pass out  MAKE SURE YOU  Understand these instructions. Will watch your condition. Will get help right away if you are not doing well or get worse.   Thank you for choosing an e-visit.  Your e-visit answers were reviewed by a board certified advanced clinical practitioner to complete your personal care plan. Depending upon the condition, your plan could have included both over the counter or prescription medications.  Please review your pharmacy choice.  Make sure the pharmacy is open so you can pick up prescription now. If there is a problem, you may contact your provider through [employer] and have the prescription routed to another pharmacy.  Your safety is important to us . If you have drug allergies check your prescription carefully.   For the next 24 hours you can use MyChart to ask questions about today's visit, request a non-urgent call back, or ask for a work or school excuse. You will get an email in the next two days asking about your experience. I hope that your e-visit has been valuable and will speed your recovery.

## 2024-05-20 NOTE — Progress Notes (Signed)
 I have spent 5 minutes in review of e-visit questionnaire, review and updating patient chart, medical decision making and response to patient.   Elsie Velma Lunger, PA-C

## 2024-05-20 NOTE — Addendum Note (Signed)
 Addended by: GLADIS ELSIE BROCKS on: 05/20/2024 03:07 PM   Modules accepted: Orders, Level of Service

## 2024-06-08 ENCOUNTER — Other Ambulatory Visit

## 2024-06-08 ENCOUNTER — Other Ambulatory Visit: Payer: Self-pay

## 2024-06-08 DIAGNOSIS — N200 Calculus of kidney: Secondary | ICD-10-CM

## 2024-06-16 ENCOUNTER — Encounter: Payer: Self-pay | Admitting: Urology

## 2024-06-16 LAB — STONE ANALYSIS
Calcium Oxalate Dihydrate: 60 %
Calcium Oxalate Monohydrate: 40 %
Weight Calculi: 128 mg

## 2024-06-17 ENCOUNTER — Ambulatory Visit: Payer: Self-pay | Admitting: Urology

## 2024-07-06 ENCOUNTER — Ambulatory Visit: Admitting: Urology

## 2024-07-06 ENCOUNTER — Ambulatory Visit
Admission: RE | Admit: 2024-07-06 | Discharge: 2024-07-06 | Disposition: A | Source: Ambulatory Visit | Attending: Urology

## 2024-07-06 ENCOUNTER — Ambulatory Visit
Admission: RE | Admit: 2024-07-06 | Discharge: 2024-07-06 | Disposition: A | Discharge status: Home / Self Care | Attending: Urology | Admitting: Urology

## 2024-07-06 VITALS — Ht 68.0 in | Wt 155.0 lb

## 2024-07-06 DIAGNOSIS — N200 Calculus of kidney: Secondary | ICD-10-CM

## 2024-07-06 NOTE — Patient Instructions (Addendum)

## 2024-07-06 NOTE — Progress Notes (Signed)
   07/06/2024 3:36 PM   Lindsey Coleman May 23, 1971 [id number]  Reason for visit: Follow up nephrolithiasis  History: History of recurrent calcium oxalate stones Left ureteroscopy, laser lithotripsy March 2025 for 2.5 cm left lower pole stone I have previously recommended 24-hour urine metabolic workup but has not been completed  Physical Exam: Ht 5' 8 (1.727 m)   Wt 155 lb (70.3 kg)   SpO2 98%   BMI 23.57 kg/m   Imaging/labs: Stone type calcium oxalate I personally viewed and interpreted the KUB today that shows no definitive evidence of radiopaque stones  Today: She passed another stone in September 2025, calcium oxalate, left side Denies any flank pain or dysuria today  Plan:   Nephrolithiasis: We discussed general stone prevention strategies including adequate hydration with goal of producing 2.5 L of urine daily, increasing citric acid intake, increasing calcium intake during high oxalate meals, minimizing animal protein, and decreasing salt intake. Information about dietary recommendations given today.  24-hour urine metabolic workup for further evaluation, will contact with those results.   Redell JAYSON Burnet, MD  [facility] [address], [address] [place], [place] [address] [phone]

## 2024-07-07 ENCOUNTER — Ambulatory Visit: Admitting: Urology

## 2024-07-19 ENCOUNTER — Other Ambulatory Visit

## 2024-07-19 ENCOUNTER — Other Ambulatory Visit: Payer: Self-pay

## 2024-07-19 ENCOUNTER — Other Ambulatory Visit: Admission: RE | Admit: 2024-07-19 | Source: Home / Self Care

## 2024-07-19 DIAGNOSIS — N200 Calculus of kidney: Secondary | ICD-10-CM

## 2024-07-20 ENCOUNTER — Other Ambulatory Visit

## 2024-07-20 LAB — LITHOLINK SERUM PANEL
CO2: 26 mmol/L (ref 20–29)
Calcium: 10.4 mg/dL — ABNORMAL HIGH (ref 8.7–10.2)
Chloride: 101 mmol/L (ref 96–106)
Creatinine, Ser: 1.03 mg/dL — ABNORMAL HIGH (ref 0.57–1.00)
Magnesium: 2.1 mg/dL (ref 1.6–2.3)
Phosphorus: 3.8 mg/dL (ref 3.0–4.3)
Potassium: 4.9 mmol/L (ref 3.5–5.2)
Sodium: 139 mmol/L (ref 134–144)
Uric Acid: 4.9 mg/dL (ref 3.0–7.2)
eGFR: 65 mL/min/1.73 (ref >59)
